# Patient Record
Sex: Female | Born: 1975 | Race: Asian | Hispanic: No | Marital: Married | Smoking: Never smoker
Health system: Southern US, Community
[De-identification: ages and names within clinical notes are randomized; demographics above are authoritative.]

---

## 2006-02-19 ENCOUNTER — Other Ambulatory Visit: Admission: RE | Admit: 2006-02-19 | Discharge: 2006-02-19 | Payer: Self-pay | Admitting: Gynecology

## 2006-05-07 ENCOUNTER — Ambulatory Visit (HOSPITAL_COMMUNITY): Admission: RE | Admit: 2006-05-07 | Discharge: 2006-05-07 | Payer: Self-pay | Admitting: Gynecology

## 2007-05-08 ENCOUNTER — Other Ambulatory Visit: Admission: RE | Admit: 2007-05-08 | Discharge: 2007-05-08 | Payer: Self-pay | Admitting: Obstetrics and Gynecology

## 2008-01-21 ENCOUNTER — Ambulatory Visit (HOSPITAL_COMMUNITY): Admission: RE | Admit: 2008-01-21 | Discharge: 2008-01-21 | Payer: Self-pay | Admitting: Obstetrics and Gynecology

## 2008-01-21 ENCOUNTER — Encounter (INDEPENDENT_AMBULATORY_CARE_PROVIDER_SITE_OTHER): Payer: Self-pay | Admitting: Obstetrics and Gynecology

## 2008-11-16 ENCOUNTER — Other Ambulatory Visit: Admission: RE | Admit: 2008-11-16 | Discharge: 2008-11-16 | Payer: Self-pay | Admitting: Obstetrics and Gynecology

## 2009-04-28 ENCOUNTER — Inpatient Hospital Stay (HOSPITAL_COMMUNITY): Admission: RE | Admit: 2009-04-28 | Discharge: 2009-05-01 | Payer: Self-pay | Admitting: Obstetrics and Gynecology

## 2009-04-28 ENCOUNTER — Encounter (INDEPENDENT_AMBULATORY_CARE_PROVIDER_SITE_OTHER): Payer: Self-pay | Admitting: Obstetrics and Gynecology

## 2010-04-09 ENCOUNTER — Encounter: Payer: Self-pay | Admitting: Obstetrics and Gynecology

## 2010-05-10 ENCOUNTER — Inpatient Hospital Stay (HOSPITAL_COMMUNITY)
Admission: AD | Admit: 2010-05-10 | Discharge: 2010-05-13 | DRG: 766 | Disposition: A | Discharge status: Home / Self Care | Payer: Medicaid Other | Source: Ambulatory Visit | Attending: Obstetrics and Gynecology | Admitting: Obstetrics and Gynecology

## 2010-05-10 ENCOUNTER — Other Ambulatory Visit: Payer: Self-pay | Admitting: Obstetrics and Gynecology

## 2010-05-10 DIAGNOSIS — O34219 Maternal care for unspecified type scar from previous cesarean delivery: Principal | ICD-10-CM | POA: Diagnosis present

## 2010-05-10 LAB — CBC
HCT: 28.7 % — ABNORMAL LOW (ref 36.0–46.0)
MCH: 20.2 pg — ABNORMAL LOW (ref 26.0–34.0)
MCHC: 28.9 g/dL — ABNORMAL LOW (ref 30.0–36.0)
MCV: 69.8 fL — ABNORMAL LOW (ref 78.0–100.0)
Platelets: 244 10*3/uL (ref 150–400)
RDW: 16.7 % — ABNORMAL HIGH (ref 11.5–15.5)

## 2010-05-10 LAB — ABO/RH: ABO/RH(D): AB POS

## 2010-05-11 LAB — CBC
HCT: 26.5 % — ABNORMAL LOW (ref 36.0–46.0)
MCV: 69 fL — ABNORMAL LOW (ref 78.0–100.0)
Platelets: 215 10*3/uL (ref 150–400)
RBC: 3.84 MIL/uL — ABNORMAL LOW (ref 3.87–5.11)
RDW: 16.5 % — ABNORMAL HIGH (ref 11.5–15.5)
WBC: 16.9 10*3/uL — ABNORMAL HIGH (ref 4.0–10.5)

## 2010-05-13 NOTE — Op Note (Signed)
[name], [name]                  ACCOUNT NO.:  [id number]  MEDICAL RECORD NO.:  [id number]           PATIENT TYPE:  I  LOCATION:  9103                          FACILITY:  [facility]  PHYSICIAN:  Gerald Leitz, MD          DATE OF BIRTH:  [birth year]  DATE OF PROCEDURE:  05/10/2010 DATE OF DISCHARGE:                              OPERATIVE REPORT   PREOPERATIVE DIAGNOSES: 1. 37-week-4-day intrauterine pregnancy. 2. Nonreassuring fetal testing 3. History of cesarean section. 4. Intrauterine fetal demise. 5. Late prenatal care.  POSTOPERATIVE DIAGNOSES: 1. 37-week-4-day intrauterine pregnancy. 2. Nonreassuring fetal testing 3. History of cesarean section. 4. Intrauterine fetal demise. 5. Late prenatal care.  PROCEDURE:  Repeat low transverse cesarean section.  SURGEON:  Gerald Leitz, MD  ASSISTANT:  None.  ANESTHESIA:  Spinal.  FINDINGS:  Female infant in cephalic presentation, Apgars of 9 and 9 at 1 and 5 minutes respectively, weight of 6 pounds 11 ounces.  SPECIMEN:  Placenta.  DISPOSITION OF SPECIMEN:  Pathology.  ESTIMATED BLOOD LOSS:  400 mL.  URINE OUTPUT:  800 mL.  FLUIDS:  1800 mL.  COMPLICATIONS:  None.  INDICATIONS:  This is a 35 year old G3, P2-0-0-1 at 37 weeks and 4 days intrauterine pregnancy with late prenatal care, presented to the office for antenatal testing due to the history of IUFD at term.  She had nonreassuring fetal testing with a BVP of 4/10 and was consented for repeat cesarean section.  PROCEDURE:  The patient was taken to the operating room where she received a spinal anesthetic.  This was found to be adequate.  She was prepped and draped in the usual sterile fashion.  A Pfannenstiel  skin incision was made with the scalpel, carried down to the underlying layer of fascia.  The fascia was excised in the midline and the incision was extended laterally with Mayo scissors.  The inferior aspect of the fascial incision was grasped with Kocher clamps,  elevated, and underlying rectus muscles were dissected off.  This was repeated on the superior aspect of the fascial incision.  Peritoneum was identified, tented up, and entered sharply with Metzenbaum scissors.  The incision was extended superiorly and inferiorly with good visualization of the bladder.  An Alexis retractor was then placed.  The vesicouterine peritoneum was identified, tented up, and entered sharply with Metzenbaum scissors.  This was extended laterally and the bladder flap was created digitally.  A low transverse incision was made with the scalpel.  The membranes were ruptured and the infant's head was delivered.  Mouth and nose were bulb suctioned.  Nuchal cord x1 was reduced.  The rest of the body was delivered.  The cord was clamped x2 and cut.  The infant was handed off to the awaiting neonatologist.  Cord blood as well as cord pH was drawn and the placenta was then expressed. The uterus was cleared of all clot and debris.  The uterine incision was repaired with 0 Vicryl in a running locked fashion.  Second layer of suture was used and excellent hemostasis was noted.  The abdomen was then  copiously irrigated, cleared of all clot and debris.  Excellent hemostasis was then assured and the Alexis retractor was removed.  The fascia was reapproximated with 0 PDS.  Scarpa's fascia was reapproximated with 2-0 Vicryl and the skin was closed with staples. Sponge, lap, and needle counts were correct x2.  The patient was taken to recovery room, awakened in stable condition.     Gerald Leitz, MD     TC/MEDQ  D:  05/10/2010  T:  05/11/2010  Job:  [id number]  Electronically Signed by Gerald Leitz MD on 05/13/2010 11:13:41 AM

## 2010-05-14 LAB — CROSSMATCH
Antibody Screen: NEGATIVE
Unit division: 0

## 2010-05-25 NOTE — Discharge Summary (Signed)
  NAMEJIMMY, [name]                  ACCOUNT NO.:  [id number]  MEDICAL RECORD NO.:  [id number]           PATIENT TYPE:  I  LOCATION:  9103                          [facility]  [facility]  PHYSICIAN:  Gerald Leitz, MD          DATE OF BIRTH:  09/05/1975  DATE OF ADMISSION:  05/10/2010 DATE OF DISCHARGE:  05/13/2010                              DISCHARGE SUMMARY   ADMISSION DIAGNOSES: 1. 37-week 4-day intrauterine pregnancy. 2. Nonreassuring fetal testing. 3. History of cesarean section, desires repeat. 4. History of intrauterine fetal demise. 5. Late prenatal care.  DISCHARGE DIAGNOSES: 1. 37-week 4-day intrauterine pregnancy. 2. Nonreassuring fetal testing. 3. History of cesarean section, desires repeat. 4. History of intrauterine fetal demise. 5. Late prenatal care. 6. Status post low transverse cesarean section.  BRIEF HOSPITAL COURSE:  The patient was seen in the office on May 10, 2010, for antenatal testing secondary to a history of IUFD with the previous pregnancy.  She was noted to have a BPP of 4/10 and was taken to the [facility] and had a cesarean section on May 10, 2010, delivered a live born female infant with Apgars of 9 and 9 at 1 and 5 minutes respectively, weighing 6 pounds 11 ounces.  She did well postoperatively.  Her hemoglobin on postop day #1 was 7.8.  She was discharged home in stable and improved condition on the following medications:  Motrin, Percocet, iron, and Reglan.  She will follow up in the office on February 27 for staple removal.     Gerald Leitz, MD     TC/MEDQ  D:  05/13/2010  T:  05/13/2010  Job:  [id number]  Electronically Signed by Gerald Leitz MD on 05/24/2010 08:41:24 AM

## 2010-06-06 LAB — CBC
HCT: 35.3 % — ABNORMAL LOW (ref 36.0–46.0)
Hemoglobin: 10.8 g/dL — ABNORMAL LOW (ref 12.0–15.0)
MCHC: 30.7 g/dL (ref 30.0–36.0)
MCHC: 30.8 g/dL (ref 30.0–36.0)
MCV: 74.9 fL — ABNORMAL LOW (ref 78.0–100.0)
MCV: 75.9 fL — ABNORMAL LOW (ref 78.0–100.0)
Platelets: 202 10*3/uL (ref 150–400)
RDW: 18.2 % — ABNORMAL HIGH (ref 11.5–15.5)

## 2010-07-31 NOTE — H&P (Signed)
[name], [name]                   ACCOUNT NO.:  [id number]   MEDICAL RECORD NO.:  [id number]           PATIENT TYPE:   LOCATION:                                 FACILITY:   PHYSICIAN:  Charles A. Delcambre, MDDATE OF BIRTH:  1975-06-03   DATE OF ADMISSION:  DATE OF DISCHARGE:                              HISTORY & PHYSICAL   This patient to be admitted to undergo diagnostic laparoscopy, possible  laser use secondary to completion of an  infertility workup and  inability to get pregnant.  She is a 35 year old, gravida 1, para 1-0-0-  0, in neonatal demise with vaginal birth.  She has periods lasting 2-3  days and then intermenstrual bleeding for spotting in the mid cycle as  well.  For this reason, we will have her undergo diagnostic hysteroscopy  and chromotubation.  She has had a normal hysterosalpingogram, normal  postcoital test, basal body temperatures have been difficult for her to  do, but she did have normal ovulatory progesterone, semen analysis on  her husband was normal.  We will proceed with laparoscopy at this time  and hysteroscopy D&C secondary to the irregular bleeding.  She gives  informed consent, accepts risks of infection, bleeding, bowel and  bladder damage, ureteral damage, blood product risk including hepatitis  and HIV exposure, uterine perforation, failure to control, lysis of  adhesions or cauterize all implants of endometriosis, operative  endoscopy, as well as hysteroscopy.  Consent is given.   PAST MEDICAL HISTORY:  None.   SURGICAL HISTORY:  None.   MEDICATIONS:  None.   ALLERGIES:  No known drug allergies.   SOCIAL HISTORY:  No tobacco, ethanol, or illicit drug use.  Married,  sexually active woman, one partner, husband monogamously.   FAMILY HISTORY:  Negative high blood pressure, diabetes, stroke or heart  attack, uterine cancer, breast cancer, ovarian cancer, osteoporosis.   REVIEW OF SYSTEMS:  Denies fever, chills, nausea, vomiting,  diarrhea,  constipation, bleeding per rectum, bleeding in the urine, chest pain,  shortness of breath, headache, vision changes.  She does have irregular  bleeding, intermenstrual bleeding as noted above.   PHYSICAL EXAMINATION:  GENERAL:  Alert and oriented x3.  No distress.  VITAL SIGNS:  Blood pressure 90/70, heart rate 64, weight 127 pounds.  CORONARY:  Regular rate and rhythm.  LUNGS:  Clear bilaterally.  ABDOMEN: Soft, nontender, no hepatosplenomegaly, no abdominal masses  noted.  PELVIC EXAM:  Normal external female genitalia.  Bartholin, urethra,  Skene within normal limit.  The vault without discharge or lesions.  Pap  was negative, February 2009.  Uterus nonenlarged mid plane.  ADNEXA:  Nontender without masses bilaterally.  Ovaries palpable, normal  in size bilaterally.   ASSESSMENT:  1. Infertility.  2. Bleeding.  We will proceed with diagnostic laparoscopy, operative      endoscopy as indicated.  Possible laser use, hysteroscopy, dilation      and curettage.  She will remain n.p.o. past night, even prior to      surgery.  Surgery is scheduled for January 25, 2008, which will  occur within the first 2 weeks with her phase of her cycle.  All      questions were answered and proceeded as outlined.      Charles A. Sydnee Cabal, MD  Electronically Signed     CAD/MEDQ  D:  12/25/2007  T:  12/26/2007  Job:  [id number]

## 2010-07-31 NOTE — Op Note (Signed)
[name], [name]                   ACCOUNT NO.:  [id number]   MEDICAL RECORD NO.:  [id number]          PATIENT TYPE:  AMB   LOCATION:  SDC                           [facility]  [facility]   PHYSICIAN:  [name], MDDATE OF BIRTH:  07-28-1975   DATE OF PROCEDURE:  01/21/2008  DATE OF DISCHARGE:                               OPERATIVE REPORT   PREOPERATIVE DIAGNOSES:  1. Infertility.  2. Abnormal uterine bleeding.   POSTOPERATIVE DIAGNOSES:  Severe endometriosis, endometrial polyps.   PROCEDURES:  1. Diagnostic laparoscopy.  2. Lysis of adhesions, moderate.  3. Left ureterolysis, major.  4. Cautery of endometriosis implants.  5. Biopsy of left uterosacral ligament implant.  6. Hysteroscopy.  7. Dilation and curettage.  8. Paracervical block.  9. Polypectomy.   SURGEON:  Kristina A. Delcambre, MD   ASSISTANT:  None.   COMPLICATIONS:  None.   ESTIMATED BLOOD LOSS:  Less than 25 mL.   ANESTHESIA:  General by endotracheal route.   OPERATIVE FINDINGS:  Partial cul-de-sac obliteration with the sigmoid up  against the uterosacral ligaments.  Multiple endometriosis implants  greater than 0.5 mL and appearing to be up to 3-mm deep, on the  sidewall, on the left, on the left uterine ovarian fossa, on the  uterosacral ligament mainly on the left, the center portion of the  uterosacral ligament where the sigmoid was adherent.  Fallopian tubes  appeared normal bilaterally and there was bilateral fill and spill with  chromotubation.   SPECIMEN:  Biopsy of the left uterosacral ligament implant to pathology.   DESCRIPTION OF PROCEDURE:  The patient was taken to the operating room,  placed in supine position.  She was placed in dorsal lithotomy position  after general anesthetic was induced without difficulty, and sterile  prep and drape was undertaken.  Cohen cannula was placed on the cervix  with tenaculum.  Paracervical block was done with 0.25% plain Marcaine,  this totaled  about 16 mL.  Attention was turned to the abdomen.  A 1-cm  incision was made at the umbilicus.  Veress needle was placed with  anterior traction on the abdominal wall.  Aspiration injection via  aspiration-hanging drop test all indicated an intraperitoneal location.  A 2.5 liters of CO2 all at less than 10 mmHg insufflation pressure  achieved adequate pneumoperitoneum.  Veress needle was removed in like  fashion.  Traction was placed in the anterior abdominal wall and then 10-  mm port was placed.  Under direct visualization, through the stab  incision at suprapubic area, a 5-mm port was placed.  Findings are noted  above.  Using bipolar cautery, some sharp dissection, and some blunt  dissection, most anatomy was moved to the correct anatomical position  without any injury to the organs.  The left sidewall was opened to  carefully isolate the ureter.  This was dissected down to the sidewall  to the pelvic brim area where it crossed beneath the sigmoid colon and  the bifurcation.  This was taken and sidewall was opened an area of the  major endometriosis implants.  Ureter was seen peristalsing and once  isolated, the edges of the opening where the endometriosis was contained  were cauterized with bipolar cautery.  There was no evidence of injury  to the ureter and the ureter was peristalsing after the treatment was  rendered.  Bipolar was used to cauterize other areas of endometriosis  throughout the pelvis.  In like fashion, the right ureter was seen  through the peritoneum and cautery was kept away from this area.  The  procedure was terminated in the pelvis with all known endometriosis  implants cauterized and this was felt to be done safely without damage  to the peristalsing left-to-right ureters, sigmoid colon, bladder, or  other structures.  Low-pressure visualization of the trocar site was  noted to be have good hemostasis.  The peritoneal edge was seen, which  were drawn upon  withdrawing the umbilical port.  Both ports were removed  and 0 Vicryl was placed on the umbilical port.  Skin edges fell together  well without further stitching.  For this reason, Dermabond was placed.  Dermabond was placed upon the suprapubic trocar area as well.  Attention  was turned to hysteroscopy.  Cohen cannula was removed.  Hanks dilators  were used to dilate up enough to pass the 5-mm scope.  Multiple shaggy  areas could be seen almost at 8 cm.  Polyp forceps did not yield much  tissue, but generalized curetting was done with a small amount of tissue  coming off.  Hysteroscopy was seen again.  There was no evidence of  perforation.  The previous polypoid areas were resected.  All  instruments were removed.  Tenaculum was removed.  Several nitrate was  used to cauterize the tenaculum site on the cervix.  The patient was  awakened and taken to recovery with physician in attendance and having  tolerated the procedure well.      Kristina A. Sydnee Cabal, MD  Electronically Signed     CAD/MEDQ  D:  01/21/2008  T:  01/22/2008  Job:  [id number]

## 2010-12-18 LAB — CBC
MCHC: 31
MCV: 75.7 — ABNORMAL LOW
Platelets: 241
RBC: 4.54
RDW: 13.3

## 2012-07-18 ENCOUNTER — Encounter (HOSPITAL_COMMUNITY): Payer: Self-pay | Admitting: Obstetrics and Gynecology

## 2012-07-18 ENCOUNTER — Ambulatory Visit (HOSPITAL_COMMUNITY): Payer: Medicaid Other

## 2012-07-18 ENCOUNTER — Inpatient Hospital Stay (HOSPITAL_COMMUNITY): Payer: Medicaid Other

## 2012-07-18 ENCOUNTER — Encounter (HOSPITAL_COMMUNITY)
Admission: AD | Disposition: A | Discharge status: Home / Self Care | Payer: Self-pay | Source: Ambulatory Visit | Attending: Obstetrics and Gynecology

## 2012-07-18 ENCOUNTER — Inpatient Hospital Stay (HOSPITAL_COMMUNITY)
Admission: RE | Admit: 2012-07-18 | Discharge: 2012-07-18 | Disposition: A | Discharge status: Home / Self Care | Payer: Medicaid Other | Source: Ambulatory Visit

## 2012-07-18 ENCOUNTER — Encounter (HOSPITAL_COMMUNITY): Payer: Self-pay

## 2012-07-18 ENCOUNTER — Ambulatory Visit (HOSPITAL_COMMUNITY)
Admission: AD | Admit: 2012-07-18 | Discharge: 2012-07-18 | Disposition: A | Discharge status: Home / Self Care | Payer: Medicaid Other | Source: Ambulatory Visit | Attending: Obstetrics and Gynecology | Admitting: Obstetrics and Gynecology

## 2012-07-18 DIAGNOSIS — O034 Incomplete spontaneous abortion without complication: Secondary | ICD-10-CM | POA: Insufficient documentation

## 2012-07-18 DIAGNOSIS — Z9889 Other specified postprocedural states: Secondary | ICD-10-CM

## 2012-07-18 HISTORY — PX: DILATION AND EVACUATION: SHX1459

## 2012-07-18 LAB — TYPE AND SCREEN: ABO/RH(D): AB POS

## 2012-07-18 LAB — POCT PREGNANCY, URINE: Preg Test, Ur: POSITIVE — AB

## 2012-07-18 LAB — URINALYSIS, ROUTINE W REFLEX MICROSCOPIC
Ketones, ur: NEGATIVE mg/dL
Leukocytes, UA: NEGATIVE
Protein, ur: NEGATIVE mg/dL
Specific Gravity, Urine: >1.03 — ABNORMAL HIGH (ref 1.005–1.030)
pH: 6 (ref 5.0–8.0)

## 2012-07-18 LAB — ABO/RH: ABO/RH(D): AB POS

## 2012-07-18 LAB — URINE MICROSCOPIC-ADD ON

## 2012-07-18 LAB — WET PREP, GENITAL
Clue Cells Wet Prep HPF POC: NONE SEEN
Trich, Wet Prep: NONE SEEN
Yeast Wet Prep HPF POC: NONE SEEN

## 2012-07-18 LAB — CBC WITH DIFFERENTIAL/PLATELET
Basophils Relative: 0 % (ref 0–1)
Lymphocytes Relative: 25 % (ref 12–46)
Lymphs Abs: 1.8 10*3/uL (ref 0.7–4.0)
Monocytes Absolute: 0.4 10*3/uL (ref 0.1–1.0)
Neutro Abs: 4.3 10*3/uL (ref 1.7–7.7)
Neutrophils Relative %: 58 % (ref 43–77)
RBC: 3.88 MIL/uL (ref 3.87–5.11)
RDW: 13.7 % (ref 11.5–15.5)

## 2012-07-18 SURGERY — DILATION AND EVACUATION, UTERUS
Anesthesia: Monitor Anesthesia Care | Site: Uterus | Wound class: Clean Contaminated

## 2012-07-18 MED ORDER — FENTANYL CITRATE 0.05 MG/ML IJ SOLN: 25.0000 ug | INTRAMUSCULAR | Status: DC | PRN

## 2012-07-18 MED ORDER — IBUPROFEN 200 MG PO TABS: 600.0000 mg | ORAL_TABLET | Freq: Four times a day (QID) | ORAL | Status: DC | PRN

## 2012-07-18 MED ORDER — MIDAZOLAM HCL 5 MG/5ML IJ SOLN
INTRAMUSCULAR | Status: DC | PRN
  Administered 2012-07-18 (×2): 1 mg via INTRAVENOUS

## 2012-07-18 MED ORDER — CEFAZOLIN SODIUM-DEXTROSE 2-3 GM-% IV SOLR: 2.0000 g | INTRAVENOUS | Status: DC

## 2012-07-18 MED ORDER — ONDANSETRON HCL 4 MG/2ML IJ SOLN: 4.0000 mg | Freq: Once | INTRAMUSCULAR | Status: DC | PRN

## 2012-07-18 MED ORDER — PROPOFOL 10 MG/ML IV EMUL
INTRAVENOUS | Status: DC | PRN
  Administered 2012-07-18: 40 mg via INTRAVENOUS
  Administered 2012-07-18 (×5): 20 mg via INTRAVENOUS
  Administered 2012-07-18: 10 mg via INTRAVENOUS
  Administered 2012-07-18 (×4): 20 mg via INTRAVENOUS
  Administered 2012-07-18: 10 mg via INTRAVENOUS

## 2012-07-18 MED ORDER — SILVER NITRATE-POT NITRATE 75-25 % EX MISC
CUTANEOUS | Status: DC | PRN
  Administered 2012-07-18: 2

## 2012-07-18 MED ORDER — FAMOTIDINE IN NACL 20-0.9 MG/50ML-% IV SOLN
20.0000 mg | Freq: Once | INTRAVENOUS | Status: AC
  Administered 2012-07-18: 20 mg via INTRAVENOUS
  Filled 2012-07-18: qty 50

## 2012-07-18 MED ORDER — FENTANYL CITRATE 0.05 MG/ML IJ SOLN
INTRAMUSCULAR | Status: DC | PRN
  Administered 2012-07-18: 100 ug via INTRAVENOUS

## 2012-07-18 MED ORDER — CEFAZOLIN SODIUM-DEXTROSE 2-3 GM-% IV SOLR
2.0000 g | Freq: Once | INTRAVENOUS | Status: AC
  Administered 2012-07-18: 2 g via INTRAVENOUS

## 2012-07-18 MED ORDER — CITRIC ACID-SODIUM CITRATE 334-500 MG/5ML PO SOLN
30.0000 mL | Freq: Once | ORAL | Status: AC
  Administered 2012-07-18: 30 mL via ORAL
  Filled 2012-07-18: qty 15

## 2012-07-18 MED ORDER — LACTATED RINGERS IV SOLN
INTRAVENOUS | Status: DC | PRN
  Administered 2012-07-18 (×3): via INTRAVENOUS

## 2012-07-18 MED ORDER — KETOROLAC TROMETHAMINE 30 MG/ML IJ SOLN: 15.0000 mg | Freq: Once | INTRAMUSCULAR | Status: DC | PRN

## 2012-07-18 MED ORDER — DEXAMETHASONE SODIUM PHOSPHATE 4 MG/ML IJ SOLN
INTRAMUSCULAR | Status: DC | PRN
  Administered 2012-07-18: 10 mg via INTRAVENOUS

## 2012-07-18 MED ORDER — MISOPROSTOL 200 MCG PO TABS: 200.0000 ug | ORAL_TABLET | Freq: Four times a day (QID) | ORAL | Status: DC

## 2012-07-18 MED ORDER — LACTATED RINGERS IV SOLN
INTRAVENOUS | Status: DC
  Administered 2012-07-18: 11:00:00 via INTRAVENOUS

## 2012-07-18 MED ORDER — METHYLERGONOVINE MALEATE 0.2 MG/ML IJ SOLN
INTRAMUSCULAR | Status: DC | PRN
  Administered 2012-07-18: 0.2 mg via INTRAMUSCULAR

## 2012-07-18 MED ORDER — SODIUM CHLORIDE 0.9 % IR SOLN
Status: DC | PRN
  Administered 2012-07-18: 1000 mL

## 2012-07-18 MED ORDER — MEPERIDINE HCL 25 MG/ML IJ SOLN: 6.2500 mg | INTRAMUSCULAR | Status: DC | PRN

## 2012-07-18 MED ORDER — KETOROLAC TROMETHAMINE 30 MG/ML IJ SOLN
INTRAMUSCULAR | Status: DC | PRN
  Administered 2012-07-18: 30 mg via INTRAVENOUS

## 2012-07-18 MED ORDER — LIDOCAINE HCL 2 % IJ SOLN
INTRAMUSCULAR | Status: DC | PRN
  Administered 2012-07-18: 10 mL

## 2012-07-18 MED ORDER — LACTATED RINGERS IV BOLUS (SEPSIS): 1000.0000 mL | Freq: Once | INTRAVENOUS | Status: DC

## 2012-07-18 MED ORDER — ONDANSETRON HCL 4 MG/2ML IJ SOLN
INTRAMUSCULAR | Status: DC | PRN
  Administered 2012-07-18: 4 mg via INTRAVENOUS

## 2012-07-18 MED ORDER — CEFAZOLIN SODIUM-DEXTROSE 2-3 GM-% IV SOLR
2.0000 g | Freq: Once | INTRAVENOUS | Status: DC
  Filled 2012-07-18: qty 50

## 2012-07-18 SURGICAL SUPPLY — 19 items
CATH ROBINSON RED A/P 16FR (CATHETERS) ×2 IMPLANT
CLOTH BEACON ORANGE TIMEOUT ST (SAFETY) ×2 IMPLANT
DECANTER SPIKE VIAL GLASS SM (MISCELLANEOUS) ×2 IMPLANT
GLOVE BIO SURGEON STRL SZ7 (GLOVE) ×4 IMPLANT
GOWN STRL REIN XL XLG (GOWN DISPOSABLE) ×4 IMPLANT
KIT BERKELEY 1ST TRIMESTER 3/8 (MISCELLANEOUS) ×2 IMPLANT
NDL SPNL 22GX3.5 QUINCKE BK (NEEDLE) ×1 IMPLANT
NEEDLE SPNL 22GX3.5 QUINCKE BK (NEEDLE) ×2 IMPLANT
NS IRRIG 1000ML POUR BTL (IV SOLUTION) ×2 IMPLANT
PACK VAGINAL MINOR WOMEN LF (CUSTOM PROCEDURE TRAY) ×2 IMPLANT
PAD OB MATERNITY 4.3X12.25 (PERSONAL CARE ITEMS) ×2 IMPLANT
PAD PREP 24X48 CUFFED NSTRL (MISCELLANEOUS) ×2 IMPLANT
SET BERKELEY SUCTION TUBING (SUCTIONS) ×2 IMPLANT
SYR CONTROL 10ML LL (SYRINGE) ×2 IMPLANT
TOWEL OR 17X24 6PK STRL BLUE (TOWEL DISPOSABLE) ×4 IMPLANT
VACURETTE 10 RIGID CVD (CANNULA) IMPLANT
VACURETTE 7MM CVD STRL WRAP (CANNULA) IMPLANT
VACURETTE 8 RIGID CVD (CANNULA) ×1 IMPLANT
VACURETTE 9 RIGID CVD (CANNULA) IMPLANT

## 2012-07-18 NOTE — MAU Note (Signed)
Pt's sister used for interpretor per patient requests. Interpretor name HME

## 2012-07-18 NOTE — Anesthesia Postprocedure Evaluation (Signed)
Anesthesia Post Note  Patient: [name]  Procedure(s) Performed: Procedure(s) (LRB): DILATATION AND EVACUATION (N/A)  Anesthesia type: MAC  Patient location: PACU  Post pain: Pain level controlled  Post assessment: Post-op Vital signs reviewed  Last Vitals:  Filed Vitals:   07/18/12 1315  BP: 105/60  Pulse: 83  Temp:   Resp: 16    Post vital signs: Reviewed  Level of consciousness: sedated  Complications: No apparent anesthesia complications

## 2012-07-18 NOTE — Transfer of Care (Signed)
Immediate Anesthesia Transfer of Care Note  Patient: [name]  Procedure(s) Performed: Procedure(s): DILATATION AND EVACUATION (N/A)  Patient Location: PACU  Anesthesia Type:MAC  Level of Consciousness: awake, alert  and oriented  Airway & Oxygen Therapy: Patient Spontanous Breathing and Patient connected to nasal cannula oxygen  Post-op Assessment: Report given to PACU RN and Post -op Vital signs reviewed and stable  Post vital signs: Reviewed and stable  Complications: No apparent anesthesia complications

## 2012-07-18 NOTE — MAU Provider Note (Signed)
History     CSN: [id number]  Arrival date and time: 07/18/12 7829   First Provider Initiated Contact with Patient 07/18/12 [id number]      Chief Complaint  Patient presents with  . Vaginal Bleeding   HPI [name] is 37 y.o. [id number] [redacted]w[redacted]d weeks presenting with vaginal bleeding in first trimester pregnancy.  Began 4 days ago and is now heavier, occurs only when she urinates.  Dark in color, tiny clots.   Lower abdominal cramping.  Denies dysuria.   She is a patient of Dr. Dawayne Patricia.  She has been seen for UPT in the office.  She called the office yesterday and instructed to come here last night but the sister states the bleeding was light so they waited until this morning to come in.  She is asking for Verification letter for Medicaid. She doesn't speak much [place], sister requesting to be her interpreter.     No past medical history on file.  No past surgical history on file.  No family history on file.  History  Substance Use Topics  . Smoking status: Not on file  . Smokeless tobacco: Not on file  . Alcohol Use: Not on file    Allergies: No Known Allergies  No prescriptions prior to admission    Review of Systems  Constitutional: Negative for fever and chills.  Gastrointestinal: Positive for abdominal pain (lower abdominal cramping). Negative for nausea and vomiting.  Genitourinary: Positive for frequency. Negative for dysuria.       + for vaginal bleeding  Neurological: Negative for headaches.   Physical Exam   Blood pressure 116/73, pulse 90, temperature 98.2 F (36.8 C), temperature source Oral, resp. rate 18, last menstrual period 04/12/2012.  Physical Exam  Nursing note and vitals reviewed. Constitutional: She is oriented to person, place, and time. She appears well-developed and well-nourished. No distress.  HENT:  Head: Normocephalic.  Neck: Normal range of motion.  Cardiovascular: Normal rate.   Respiratory: Effort normal.  GI: Soft. She exhibits no distension  and no mass. There is no tenderness. There is no rebound and no guarding.  Genitourinary: There is no rash, tenderness or lesion on the right labia. There is no rash, tenderness or lesion on the left labia. Uterus is enlarged. Uterus is not tender. Cervix exhibits no motion tenderness, no discharge and no friability. Right adnexum displays no mass, no tenderness and no fullness. Left adnexum displays no mass, no tenderness and no fullness. There is bleeding (small amount of dark blood with stringy clots) around the vagina. No erythema or tenderness around the vagina. No vaginal discharge found.  Neurological: She is alert and oriented to person, place, and time.  Skin: Skin is warm and dry.  Psychiatric: She has a normal mood and affect. Her behavior is normal.   Results for orders placed during the hospital encounter of 07/18/12 (from the past 24 hour(s))  URINALYSIS, ROUTINE W REFLEX MICROSCOPIC     Status: Abnormal   Collection Time    07/18/12  8:45 AM      Result Value Range   Color, Urine YELLOW  YELLOW   APPearance HAZY (*) CLEAR   Specific Gravity, Urine >1.030 (*) 1.005 - 1.030   pH 6.0  5.0 - 8.0   Glucose, UA NEGATIVE  NEGATIVE mg/dL   Hgb urine dipstick LARGE (*) NEGATIVE   Bilirubin Urine NEGATIVE  NEGATIVE   Ketones, ur NEGATIVE  NEGATIVE mg/dL   Protein, ur NEGATIVE  NEGATIVE mg/dL  Urobilinogen, UA 0.2  0.0 - 1.0 mg/dL   Nitrite NEGATIVE  NEGATIVE   Leukocytes, UA NEGATIVE  NEGATIVE  URINE MICROSCOPIC-ADD ON     Status: Abnormal   Collection Time    07/18/12  8:45 AM      Result Value Range   Squamous Epithelial / LPF FEW (*) RARE   WBC, UA 0-2  <3 WBC/hpf   RBC / HPF 11-20  <3 RBC/hpf   Bacteria, UA FEW (*) RARE  POCT PREGNANCY, URINE     Status: Abnormal   Collection Time    07/18/12  9:14 AM      Result Value Range   Preg Test, Ur POSITIVE (*) NEGATIVE  WET PREP, GENITAL     Status: Abnormal   Collection Time    07/18/12  9:25 AM      Result Value Range    Yeast Wet Prep HPF POC NONE SEEN  NONE SEEN   Trich, Wet Prep NONE SEEN  NONE SEEN   Clue Cells Wet Prep HPF POC NONE SEEN  NONE SEEN   WBC, Wet Prep HPF POC FEW (*) NONE SEEN  CBC WITH DIFFERENTIAL     Status: Abnormal   Collection Time    07/18/12  9:39 AM      Result Value Range   WBC 7.3  4.0 - 10.5 K/uL   RBC 3.88  3.87 - 5.11 MIL/uL   Hemoglobin 8.9 (*) 12.0 - 15.0 g/dL   HCT 08.6 (*) 57.8 - 46.9 %   MCV 74.0 (*) 78.0 - 100.0 fL   MCH 22.9 (*) 26.0 - 34.0 pg   MCHC 31.0  30.0 - 36.0 g/dL   RDW 62.9  52.8 - 41.3 %   Platelets 248  150 - 400 K/uL   Neutrophils Relative 58  43 - 77 %   Lymphocytes Relative 25  12 - 46 %   Monocytes Relative 6  3 - 12 %   Eosinophils Relative 11 (*) 0 - 5 %   Basophils Relative 0  0 - 1 %   Neutro Abs 4.3  1.7 - 7.7 K/uL   Lymphs Abs 1.8  0.7 - 4.0 K/uL   Monocytes Absolute 0.4  0.1 - 1.0 K/uL   Eosinophils Absolute 0.8 (*) 0.0 - 0.7 K/uL   Basophils Absolute 0.0  0.0 - 0.1 K/uL   RBC Morphology POLYCHROMASIA PRESENT    ABO/RH     Status: None   Collection Time    07/18/12  9:40 AM      Result Value Range   ABO/RH(D) AB POS    HCG, QUANTITATIVE, PREGNANCY     Status: Abnormal   Collection Time    07/18/12  9:40 AM      Result Value Range   hCG, Beta Chain, Quant, S 2833 (*) <5 mIU/mL   Clinical Data: Bleeding.  OBSTETRIC <14 WK [place] AND TRANSVAGINAL OB US  Technique: Both transabdominal and transvaginal ultrasound  examinations were performed for complete evaluation of the  gestation as well as the maternal uterus, adnexal regions, and  pelvic cul-de-sac. Transvaginal technique was performed to assess  early pregnancy.  Comparison: 04/28/2009  Findings: No evidence for a live intrauterine pregnancy. There is  a fluid collection within the endometrial cavity. The endometrial  fluid collection measures up to 5.2 cm in length. Endometrium is  diffusely thickened and irregular, roughly measures 3 cm. There is  a focal hypoechoic area  within the endometrium. There is some  vascular flow within the thickened endometrium.  The left ovary measures 3.2 x 1.1 x 1.4 cm. Right ovary measures  2.7 x 1.38 x 1.9 cm. No evidence for free fluid.  IMPRESSION: There is a large amount of fluid within endometrial  cavity and the endometrium appears to be thickened and irregular.  Retained products of conception cannot be excluded. No evidence  for a live intrauterine pregnancy.  Normal appearance of the ovaries.  Original Report Authenticated By: Richarda Overlie, M.D.    MAU Course  Procedures  MDM On return from Ultrasound, the tech reports the patient told her she passed the pregnancy 5 days ago--this was not reported to me at the time of her MSE.  I reported MSE, physical exam findings, labs and U/S report to Dr. Dion Body.  Plan-Suction D&C. I discussed at length the findings, lab and Ultrasound results with the patient.  She agrees with plan for D&C.  She reports having not had anything to eat or drink since last night.  I asked her this question several times to make sure she has been NPO for 6 hrs.  Same answer each time--nothing since last night.    Assessment and Plan  A: Retained products of conception    Anemia  P:  To OR for Suction D&C by Dr. Caren Macadam M 07/18/2012, 11:01 AM

## 2012-07-18 NOTE — Brief Op Note (Signed)
07/18/2012  1:42 PM  PATIENT:  [name]  37 y.o. female  PRE-OPERATIVE DIAGNOSIS:  incomplete abortion  POST-OPERATIVE DIAGNOSIS:  incomplete abortion  PROCEDURE:  Suction D&C  SURGEON:  Surgeon(s) and Role:    * Geryl Rankins, MD - Primary  PHYSICIAN ASSISTANT:   ASSISTANTS: none   ANESTHESIA:   Paracervical block, IV sedation  EBL:  Total I/O In: 1500 [I.V.:1500] Out: 475 [Urine:400; Blood:75]  BLOOD ADMINISTERED:none  DRAINS: none   LOCAL MEDICATIONS USED:  2% LIDOCAINE 10 cc  SPECIMEN:  Source of Specimen:  Products of conception  DISPOSITION OF SPECIMEN:  PATHOLOGY  COUNTS:  YES  TOURNIQUET:  * No tourniquets in log *  DICTATION: .Other Dictation: Dictation Number [id number]  PLAN OF CARE: Discharge to home after PACU  PATIENT DISPOSITION:  PACU - hemodynamically stable.   Delay start of Pharmacological VTE agent (>24hrs) due to surgical blood loss or risk of bleeding: not applicable

## 2012-07-18 NOTE — OR Nursing (Signed)
Patient sent for at 1140.

## 2012-07-18 NOTE — MAU Note (Signed)
[name] is here today with complaints of bleeding. She is [redacted] weeks pregnant and starting spotting on the 25th of April. The bleeding has increased and is heavy today.

## 2012-07-18 NOTE — H&P (Signed)
[name] is an 37 y.o. female G4 P3002 at ~13 weeks by LMP presents with c/o vaginal bleeding.  H/o obtained with the assistance of a translator, pt's sister. Pt had verification of pregnancy at [facility] 2 weeks ago.  Five days ago she started having vaginal bleeding.  She states she felt like the pregnancy passed 5 days ago but her bleeding was never heavier than a period.  States bleeding is light.  Passing small clots occasionally.  Pertinent Gynecological History: MOB History: G4, P   Menstrual History:  Patient's last menstrual period was 04/12/2012.    No past medical history on file.  No past surgical history on file.  No family history on file.  Social History:  has no tobacco, alcohol, and drug history on file.  Allergies: No Known Allergies  No prescriptions prior to admission    Review of Systems  Respiratory: Negative for shortness of breath.   Gastrointestinal: Negative for abdominal pain.    Blood pressure 109/54, pulse 86, temperature 98.2 F (36.8 C), temperature source Oral, resp. rate 18, height 5' (1.524 m), weight 65.318 kg (144 lb), last menstrual period 04/12/2012. Physical Exam  Constitutional: She appears well-developed and well-nourished. No distress.  HENT:  Head: Normocephalic and atraumatic.  Eyes: EOM are normal.  Neck: Normal range of motion.  Cardiovascular: Normal rate, regular rhythm and normal heart sounds.   No murmur heard. Respiratory: Effort normal and breath sounds normal. No respiratory distress. She has no wheezes.  GI: Soft.  Genitourinary:  Deferred to OR.  Reviewed provider notes.  Musculoskeletal: She exhibits no edema and no tenderness.  Neurological: She is alert.  Skin: Skin is warm and dry. She is not diaphoretic.  Psychiatric: She has a normal mood and affect.    Results for orders placed during the hospital encounter of 07/18/12 (from the past 24 hour(s))  URINALYSIS, ROUTINE W REFLEX MICROSCOPIC     Status:  Abnormal   Collection Time    07/18/12  8:45 AM      Result Value Range   Color, Urine YELLOW  YELLOW   APPearance HAZY (*) CLEAR   Specific Gravity, Urine >1.030 (*) 1.005 - 1.030   pH 6.0  5.0 - 8.0   Glucose, UA NEGATIVE  NEGATIVE mg/dL   Hgb urine dipstick LARGE (*) NEGATIVE   Bilirubin Urine NEGATIVE  NEGATIVE   Ketones, ur NEGATIVE  NEGATIVE mg/dL   Protein, ur NEGATIVE  NEGATIVE mg/dL   Urobilinogen, UA 0.2  0.0 - 1.0 mg/dL   Nitrite NEGATIVE  NEGATIVE   Leukocytes, UA NEGATIVE  NEGATIVE  URINE MICROSCOPIC-ADD ON     Status: Abnormal   Collection Time    07/18/12  8:45 AM      Result Value Range   Squamous Epithelial / LPF FEW (*) RARE   WBC, UA 0-2  <3 WBC/hpf   RBC / HPF 11-20  <3 RBC/hpf   Bacteria, UA FEW (*) RARE  POCT PREGNANCY, URINE     Status: Abnormal   Collection Time    07/18/12  9:14 AM      Result Value Range   Preg Test, Ur POSITIVE (*) NEGATIVE  WET PREP, GENITAL     Status: Abnormal   Collection Time    07/18/12  9:25 AM      Result Value Range   Yeast Wet Prep HPF POC NONE SEEN  NONE SEEN   Trich, Wet Prep NONE SEEN  NONE SEEN   Clue  Cells Wet Prep HPF POC NONE SEEN  NONE SEEN   WBC, Wet Prep HPF POC FEW (*) NONE SEEN  CBC WITH DIFFERENTIAL     Status: Abnormal   Collection Time    07/18/12  9:39 AM      Result Value Range   WBC 7.3  4.0 - 10.5 K/uL   RBC 3.88  3.87 - 5.11 MIL/uL   Hemoglobin 8.9 (*) 12.0 - 15.0 g/dL   HCT 29.5 (*) 62.1 - 30.8 %   MCV 74.0 (*) 78.0 - 100.0 fL   MCH 22.9 (*) 26.0 - 34.0 pg   MCHC 31.0  30.0 - 36.0 g/dL   RDW 65.7  84.6 - 96.2 %   Platelets 248  150 - 400 K/uL   Neutrophils Relative 58  43 - 77 %   Lymphocytes Relative 25  12 - 46 %   Monocytes Relative 6  3 - 12 %   Eosinophils Relative 11 (*) 0 - 5 %   Basophils Relative 0  0 - 1 %   Neutro Abs 4.3  1.7 - 7.7 K/uL   Lymphs Abs 1.8  0.7 - 4.0 K/uL   Monocytes Absolute 0.4  0.1 - 1.0 K/uL   Eosinophils Absolute 0.8 (*) 0.0 - 0.7 K/uL   Basophils  Absolute 0.0  0.0 - 0.1 K/uL   RBC Morphology POLYCHROMASIA PRESENT    ABO/RH     Status: None   Collection Time    07/18/12  9:40 AM      Result Value Range   ABO/RH(D) AB POS    HCG, QUANTITATIVE, PREGNANCY     Status: Abnormal   Collection Time    07/18/12  9:40 AM      Result Value Range   hCG, Beta Chain, Quant, S 2833 (*) <5 mIU/mL  TYPE AND SCREEN     Status: None   Collection Time    07/18/12  9:44 AM      Result Value Range   ABO/RH(D) AB POS     Antibody Screen PENDING     Sample Expiration 07/21/2012      US Ob Comp Less 14 Wks  07/18/2012  *RADIOLOGY REPORT*  Clinical Data: Bleeding.  OBSTETRIC <14 WK [place] AND TRANSVAGINAL OB US  Technique:  Both transabdominal and transvaginal ultrasound examinations were performed for complete evaluation of the gestation as well as the maternal uterus, adnexal regions, and pelvic cul-de-sac.  Transvaginal technique was performed to assess early pregnancy.  Comparison:  04/28/2009  Findings:  No evidence for a live intrauterine pregnancy.  There is a fluid collection within the endometrial cavity.   The endometrial fluid collection measures up to 5.2 cm in length.  Endometrium is diffusely thickened and irregular, roughly measures 3 cm.  There is a focal hypoechoic area within the endometrium.  There is some vascular flow within the thickened endometrium.  The left ovary measures 3.2 x 1.1 x 1.4 cm.  Right ovary measures 2.7 x 1.38 x 1.9 cm.  No evidence for free fluid.  IMPRESSION: There is a large amount of fluid within endometrial cavity and the endometrium appears to be thickened and irregular. Retained products of conception cannot be excluded.  No evidence for a live intrauterine pregnancy.  Normal appearance of the ovaries.   Original Report Authenticated By: Richarda Overlie, M.D.    US Ob Transvaginal  07/18/2012  *RADIOLOGY REPORT*  Clinical Data: Bleeding.  OBSTETRIC <14 WK [place] AND TRANSVAGINAL OB US  Technique:  Both transabdominal and  transvaginal ultrasound examinations were performed for complete evaluation of the gestation as well as the maternal uterus, adnexal regions, and pelvic cul-de-sac.  Transvaginal technique was performed to assess early pregnancy.  Comparison:  04/28/2009  Findings:  No evidence for a live intrauterine pregnancy.  There is a fluid collection within the endometrial cavity.   The endometrial fluid collection measures up to 5.2 cm in length.  Endometrium is diffusely thickened and irregular, roughly measures 3 cm.  There is a focal hypoechoic area within the endometrium.  There is some vascular flow within the thickened endometrium.  The left ovary measures 3.2 x 1.1 x 1.4 cm.  Right ovary measures 2.7 x 1.38 x 1.9 cm.  No evidence for free fluid.  IMPRESSION: There is a large amount of fluid within endometrial cavity and the endometrium appears to be thickened and irregular. Retained products of conception cannot be excluded.  No evidence for a live intrauterine pregnancy.  Normal appearance of the ovaries.   Original Report Authenticated By: Richarda Overlie, M.D.     Assessment/Plan: Incomplete Abortion, Retained POCs.-Suction D&C advised. Anemia.  Due to amount of tissue in uterus and low hemoglobin, Cytotec not recommended. Pt counseled on R/B/A of procedure. Consent obtained. Discharge home after PACU with Methergine x 24 hours and Iron for anemia. F/u with Dr. Richardson Dopp in 2 weeks.  Geryl Rankins 07/18/2012, 11:57 AM

## 2012-07-18 NOTE — Anesthesia Preprocedure Evaluation (Signed)
Anesthesia Evaluation  Patient identified by MRN, date of birth, ID band Patient awake    Reviewed: Allergy & Precautions, H&P , NPO status , Patient's Chart, lab work & pertinent test results  Airway Mallampati: II TM Distance: >3 FB Neck ROM: full    Dental no notable dental hx. (+) Teeth Intact   Pulmonary neg pulmonary ROS,    Pulmonary exam normal       Cardiovascular negative cardio ROS      Neuro/Psych negative neurological ROS  negative psych ROS   GI/Hepatic negative GI ROS, Neg liver ROS,   Endo/Other  negative endocrine ROS  Renal/GU negative Renal ROS  negative genitourinary   Musculoskeletal negative musculoskeletal ROS (+)   Abdominal Normal abdominal exam  (+)   Peds negative pediatric ROS (+)  Hematology negative hematology ROS (+)   Anesthesia Other Findings   Reproductive/Obstetrics negative OB ROS                           Anesthesia Physical Anesthesia Plan  ASA: II  Anesthesia Plan: MAC   Post-op Pain Management:    Induction:   Airway Management Planned:   Additional Equipment:   Intra-op Plan:   Post-operative Plan:   Informed Consent: I have reviewed the patients History and Physical, chart, labs and discussed the procedure including the risks, benefits and alternatives for the proposed anesthesia with the patient or authorized representative who has indicated his/her understanding and acceptance.     Plan Discussed with: CRNA and Surgeon  Anesthesia Plan Comments:         Anesthesia Quick Evaluation

## 2012-07-19 LAB — GC/CHLAMYDIA PROBE AMP
CT Probe RNA: NEGATIVE
GC Probe RNA: NEGATIVE

## 2012-07-19 NOTE — Op Note (Signed)
[name], [name]                  ACCOUNT NO.:  [id number]  MEDICAL RECORD NO.:  [id number]  LOCATION:  [facility]                          FACILITY:  [facility]  PHYSICIAN:  Pieter Partridge, MD   DATE OF BIRTH:  [birth year]  DATE OF PROCEDURE:  07/18/2012 DATE OF DISCHARGE:  07/18/2012                              OPERATIVE REPORT   PREOPERATIVE DIAGNOSIS:  Incomplete abortion.  POSTOPERATIVE DIAGNOSIS:  Incomplete abortion.  PROCEDURE:  Suction, dilatation and curettage.  SURGEON:  Pieter Partridge, MD  ASSISTANT:  None.  ANESTHESIA:  Paracervical block and IV sedation.  ESTIMATED BLOOD LOSS:  75.  IV FLUIDS:  1500.  URINE OUTPUT:  400.  BLOOD ADMINISTERED:  None.  DRAINS:  None.  Local 2% lidocaine 10 mL.  SPECIMEN:  Products of conception to Pathology.  DISPOSITION:  To PACU, hemodynamically stable.  COMPLICATIONS:  None.  FINDINGS:  Copious amount of retained products, and cords, uterus enlarged about 12 weeks by bimanual exam 12-13 weeks and sounded to 10 cm.  The ultrasound after procedure confirmed adequate curettage.  The patient was identified in the holding area.  She was then taken to the operating room, where she underwent IV sedation without complication.  She was prepped and draped.  She was placed in the dorsal lithotomy position and prepped and draped in a normal sterile fashion.  Graves speculum was then inserted into the vagina and a paracervical block was performed.  Of note, she had some bleeding from the just where the paracervical block.  The cervix was easily dilated up to a 7 Hegar, and 8 suction curette was then selected and advanced to the uterine fundus  after the uterus was sounded to 10 cm.  A suction curette was performed revealing a lot of tissue and several passes were made.  Then, a sharp curettage of all 4 quadrants was performed and that returned tissue as well.  Another suction curettage was performed and when the gritty cry was noted,  I just wanted to make sure that all of the tissue had been removed.  Ultrasound came in and the uterine sound was placed in the endocervical canal for easily identify and the endometrial stripe appeared thin.  The single-tooth tenaculum was applied to the anterior lip of the cervix that was removed and silver nitrate was used for hemostasis.  She did have a little bit of bleeding from the os, Methergine was given prior to the ultrasound and appeared dry, and then after placing the uterine sound again, it seemed that there are some bleeding, but with massage and pressure the bleeding reduced.  All instruments were removed from the vagina.  The patient was awakened and went to recovery room in stable condition.  She did have an INO cath prior to the procedure under sterile technique and got Ancef 2 g IV prior to the procedure.  SCDs were on and operating.  The patient tolerated the procedure well.     Pieter Partridge, MD     EBV/MEDQ  D:  07/18/2012  T:  07/19/2012  Job:  [id number]

## 2012-07-20 ENCOUNTER — Encounter (HOSPITAL_COMMUNITY): Payer: Self-pay | Admitting: Obstetrics and Gynecology

## 2012-07-21 NOTE — MAU Provider Note (Signed)
See H& P.

## 2013-05-23 ENCOUNTER — Encounter (HOSPITAL_COMMUNITY): Payer: Self-pay | Admitting: *Deleted

## 2013-05-28 ENCOUNTER — Other Ambulatory Visit: Payer: Self-pay | Admitting: Obstetrics and Gynecology

## 2013-05-28 ENCOUNTER — Other Ambulatory Visit (HOSPITAL_COMMUNITY)
Admission: RE | Admit: 2013-05-28 | Discharge: 2013-05-28 | Disposition: A | Discharge status: Home / Self Care | Payer: Medicaid Other | Source: Ambulatory Visit | Attending: Obstetrics and Gynecology | Admitting: Obstetrics and Gynecology

## 2013-05-28 DIAGNOSIS — Z01419 Encounter for gynecological examination (general) (routine) without abnormal findings: Secondary | ICD-10-CM | POA: Insufficient documentation

## 2013-05-28 DIAGNOSIS — Z113 Encounter for screening for infections with a predominantly sexual mode of transmission: Secondary | ICD-10-CM | POA: Insufficient documentation

## 2013-05-28 DIAGNOSIS — Z1151 Encounter for screening for human papillomavirus (HPV): Secondary | ICD-10-CM | POA: Insufficient documentation

## 2013-05-28 LAB — OB RESULTS CONSOLE GC/CHLAMYDIA
Chlamydia: NEGATIVE
GC PROBE AMP, GENITAL: NEGATIVE

## 2013-05-28 LAB — OB RESULTS CONSOLE HIV ANTIBODY (ROUTINE TESTING)
HIV: NONREACTIVE
HIV: NONREACTIVE

## 2013-05-28 LAB — OB RESULTS CONSOLE ABO/RH: RH Type: POSITIVE

## 2013-05-28 LAB — OB RESULTS CONSOLE RPR
RPR: NONREACTIVE
RPR: NONREACTIVE

## 2013-05-28 LAB — OB RESULTS CONSOLE ANTIBODY SCREEN: ANTIBODY SCREEN: NEGATIVE

## 2013-05-28 LAB — CYTOLOGY - PAP: Pap: NEGATIVE

## 2013-05-31 ENCOUNTER — Encounter (HOSPITAL_COMMUNITY): Payer: Self-pay | Admitting: Obstetrics and Gynecology

## 2013-06-03 ENCOUNTER — Other Ambulatory Visit (HOSPITAL_COMMUNITY): Payer: Self-pay | Admitting: Obstetrics and Gynecology

## 2013-06-03 DIAGNOSIS — O09529 Supervision of elderly multigravida, unspecified trimester: Secondary | ICD-10-CM

## 2013-06-03 DIAGNOSIS — Z3689 Encounter for other specified antenatal screening: Secondary | ICD-10-CM

## 2013-06-03 DIAGNOSIS — O09299 Supervision of pregnancy with other poor reproductive or obstetric history, unspecified trimester: Secondary | ICD-10-CM

## 2013-06-08 ENCOUNTER — Other Ambulatory Visit (HOSPITAL_COMMUNITY): Payer: Medicaid Other

## 2013-06-08 ENCOUNTER — Ambulatory Visit (HOSPITAL_COMMUNITY): Payer: Medicaid Other | Attending: Obstetrics and Gynecology

## 2013-06-08 ENCOUNTER — Ambulatory Visit (HOSPITAL_COMMUNITY): Admission: RE | Admit: 2013-06-08 | Payer: Medicaid Other | Source: Ambulatory Visit

## 2013-06-16 ENCOUNTER — Encounter: Payer: Self-pay | Admitting: *Deleted

## 2013-07-13 ENCOUNTER — Encounter: Payer: Self-pay | Admitting: Advanced Practice Midwife

## 2013-07-13 ENCOUNTER — Ambulatory Visit (INDEPENDENT_AMBULATORY_CARE_PROVIDER_SITE_OTHER): Payer: Medicaid Other | Admitting: Advanced Practice Midwife

## 2013-07-13 VITALS — BP 128/84 | HR 112 | Temp 98.3°F | Ht 58.5 in | Wt 151.9 lb

## 2013-07-13 DIAGNOSIS — Z23 Encounter for immunization: Secondary | ICD-10-CM

## 2013-07-13 DIAGNOSIS — Z8759 Personal history of other complications of pregnancy, childbirth and the puerperium: Secondary | ICD-10-CM | POA: Insufficient documentation

## 2013-07-13 DIAGNOSIS — O09299 Supervision of pregnancy with other poor reproductive or obstetric history, unspecified trimester: Secondary | ICD-10-CM

## 2013-07-13 DIAGNOSIS — O09529 Supervision of elderly multigravida, unspecified trimester: Secondary | ICD-10-CM | POA: Insufficient documentation

## 2013-07-13 DIAGNOSIS — Z3480 Encounter for supervision of other normal pregnancy, unspecified trimester: Secondary | ICD-10-CM

## 2013-07-13 DIAGNOSIS — Z3490 Encounter for supervision of normal pregnancy, unspecified, unspecified trimester: Secondary | ICD-10-CM

## 2013-07-13 DIAGNOSIS — O34219 Maternal care for unspecified type scar from previous cesarean delivery: Secondary | ICD-10-CM | POA: Insufficient documentation

## 2013-07-13 LAB — CBC
HEMATOCRIT: 30.7 % — AB (ref 36.0–46.0)
Hemoglobin: 9.5 g/dL — ABNORMAL LOW (ref 12.0–15.0)
MCH: 22.4 pg — AB (ref 26.0–34.0)
MCHC: 30.9 g/dL (ref 30.0–36.0)
MCV: 72.4 fL — AB (ref 78.0–100.0)
PLATELETS: 267 10*3/uL (ref 150–400)
RBC: 4.24 MIL/uL (ref 3.87–5.11)
RDW: 14.2 % (ref 11.5–15.5)
WBC: 9.7 10*3/uL (ref 4.0–10.5)

## 2013-07-13 LAB — POCT URINALYSIS DIP (DEVICE)
BILIRUBIN URINE: NEGATIVE
GLUCOSE, UA: NEGATIVE mg/dL
HGB URINE DIPSTICK: NEGATIVE
Ketones, ur: NEGATIVE mg/dL
Leukocytes, UA: NEGATIVE
NITRITE: NEGATIVE
PH: 7 (ref 5.0–8.0)
Protein, ur: NEGATIVE mg/dL
SPECIFIC GRAVITY, URINE: 1.015 (ref 1.005–1.030)
UROBILINOGEN UA: 0.2 mg/dL (ref 0.0–1.0)

## 2013-07-13 MED ORDER — TETANUS-DIPHTH-ACELL PERTUSSIS 5-2.5-18.5 LF-MCG/0.5 IM SUSP: 0.5000 mL | Freq: Once | INTRAMUSCULAR | Status: DC

## 2013-07-13 MED ORDER — PRENATAL VITAMINS PLUS 27-1 MG PO TABS: 1.0000 | ORAL_TABLET | Freq: Every day | ORAL | Status: DC

## 2013-07-13 NOTE — Progress Notes (Signed)
Here for initial vist- transferring care from [facility]. New patient information given. Discussed BMI/ appropriate weight gain.

## 2013-07-13 NOTE — Progress Notes (Signed)
New OB. Routines reviewed via interpretor  Subjective:    [name] is a [id number] 9547w3d being seen today for her first obstetrical visit.  Her obstetrical history is significant for advanced maternal age and Late to care, prior C/S x 2, hx stillbirth. Patient does intend to breast feed. Pregnancy history fully reviewed.  Patient reports no complaints.  First delivery was a term stillbirth, vaginal birth.  Had second two deliveries with [facility].  Both were C/S due to poor BPP, oligo and nonreassuring FHR tracings. Had D&C for Missed AB last year there. Saw them in March for New OB and had MFM appt scheduled, but pt states they never told her to go there. So has had no US or labs. Unclear why she was referred here, pt states "they told me to come here".   Filed Vitals:   07/13/13 1304 07/13/13 1317  BP: 128/84   Pulse: 112   Temp: 98.3 F (36.8 C)   Height:  4' 10.5" (1.486 m)  Weight: 68.901 kg (151 lb 14.4 oz)     HISTORY: OB History  Gravida Para Term Preterm AB SAB TAB Ectopic Multiple Living  5 3 3  1 1    2     # Outcome Date GA Lbr Len/2nd Weight Sex Delivery Anes PTL Lv  5 CUR           4 SAB 07/18/12          3 TRM 2012 2662w0d  3.175 kg (7 lb) F [facility]   Y  2 TRM 2011 4143w0d  3.317 kg (7 lb 5 oz) F [facility]   Y  1 TRM 2001    F SVD   SB     Comments: [place] in [place]; baby stillborn      History reviewed. No pertinent past medical history. Past Surgical History  Procedure Laterality Date  . Dilation and evacuation N/A 07/18/2012    Procedure: DILATATION AND EVACUATION;  Surgeon: Geryl RankinsEvelyn Varnado, MD;  Location: [facility];  Service: Gynecology;  Laterality: N/A;  . Cesarean section     History reviewed. No pertinent family history.   Exam    Uterus:  Fundal Height: 28 cm  Pelvic Exam:    Perineum: Not examined, [facility]   Vulva: Not examined   Vagina:  Not examined   pH:    Cervix: not examined   Adnexa: not evaluated   Bony Pelvis: not examined  System:  Breast:  normal appearance, no masses or tenderness   Skin: normal coloration and turgor, no rashes    Neurologic: oriented, grossly non-focal, normal mood   Extremities: normal strength, tone, and muscle mass   HEENT neck supple with midline trachea   Mouth/Teeth mucous membranes moist, pharynx normal without lesions   Neck supple and no masses   Cardiovascular: regular rate and rhythm, no murmurs or gallops   Respiratory:  appears well, vitals normal, no respiratory distress, acyanotic, normal RR, ear and throat exam is normal, neck free of mass or lymphadenopathy, chest clear, no wheezing, crepitations, rhonchi, normal symmetric air entry   Abdomen: soft, non-tender; bowel sounds normal; no masses,  no organomegaly   Urinary: not examined      Assessment:    Pregnancy: [id number] Patient Active Problem List   Diagnosis Date Noted  . History of stillbirth in currently pregnant patient 07/13/2013  . AMA (advanced maternal age) multigravida 35+ 07/13/2013  . Previous cesarean section complicating pregnancy, antepartum condition or  complication 07/13/2013        Plan:     Initial labs drawn. Prenatal vitamins. Problem list reviewed and updated. Genetic Screening discussed Quad Screen: Too late.  Ultrasound discussed; fetal survey: ordered.  Follow up in [facility] in [facility] 50% of 30 min visit spent on counseling and coordination of care.      [name] 07/13/2013

## 2013-07-13 NOTE — Patient Instructions (Signed)
Third Trimester of Pregnancy  The third trimester is from week 29 through week 42, months 7 through 9. The third trimester is a time when the fetus is growing rapidly. At the end of the ninth month, the fetus is about 20 inches in length and weighs 6 10 pounds.   BODY CHANGES  Your body goes through many changes during pregnancy. The changes vary from woman to woman.    Your weight will continue to increase. You can expect to gain 25 35 pounds (11 16 kg) by the end of the pregnancy.   You may begin to get stretch marks on your hips, abdomen, and breasts.   You may urinate more often because the fetus is moving lower into your pelvis and pressing on your bladder.   You may develop or continue to have heartburn as a result of your pregnancy.   You may develop constipation because certain hormones are causing the muscles that push waste through your intestines to slow down.   You may develop hemorrhoids or swollen, bulging veins (varicose veins).   You may have pelvic pain because of the weight gain and pregnancy hormones relaxing your joints between the bones in your pelvis. Back aches may result from over exertion of the muscles supporting your posture.   Your breasts will continue to grow and be tender. A yellow discharge may leak from your breasts called colostrum.   Your belly button may stick out.   You may feel short of breath because of your expanding uterus.   You may notice the fetus "dropping," or moving lower in your abdomen.   You may have a bloody mucus discharge. This usually occurs a few days to a week before labor begins.   Your cervix becomes thin and soft (effaced) near your due date.  WHAT TO EXPECT AT YOUR PRENATAL EXAMS   You will have prenatal exams every 2 weeks until week 36. Then, you will have weekly prenatal exams. During a routine prenatal visit:   You will be weighed to make sure you and the fetus are growing normally.   Your blood pressure is taken.   Your abdomen will be  measured to track your baby's growth.   The fetal heartbeat will be listened to.   Any test results from the previous visit will be discussed.   You may have a cervical check near your due date to see if you have effaced.  At around 36 weeks, your caregiver will check your cervix. At the same time, your caregiver will also perform a test on the secretions of the vaginal tissue. This test is to determine if a type of bacteria, Group B streptococcus, is present. Your caregiver will explain this further.  Your caregiver may ask you:   What your birth plan is.   How you are feeling.   If you are feeling the baby move.   If you have had any abnormal symptoms, such as leaking fluid, bleeding, severe headaches, or abdominal cramping.   If you have any questions.  Other tests or screenings that may be performed during your third trimester include:   Blood tests that check for low iron levels (anemia).   Fetal testing to check the health, activity level, and growth of the fetus. Testing is done if you have certain medical conditions or if there are problems during the pregnancy.  FALSE LABOR  You may feel small, irregular contractions that eventually go away. These are called Braxton Hicks contractions, or   false labor. Contractions may last for hours, days, or even weeks before true labor sets in. If contractions come at regular intervals, intensify, or become painful, it is best to be seen by your caregiver.   SIGNS OF LABOR    Menstrual-like cramps.   Contractions that are 5 minutes apart or less.   Contractions that start on the top of the uterus and spread down to the lower abdomen and back.   A sense of increased pelvic pressure or back pain.   A watery or bloody mucus discharge that comes from the vagina.  If you have any of these signs before the 37th week of pregnancy, call your caregiver right away. You need to go to the hospital to get checked immediately.  HOME CARE INSTRUCTIONS    Avoid all  smoking, herbs, alcohol, and unprescribed drugs. These chemicals affect the formation and growth of the baby.   Follow your caregiver's instructions regarding medicine use. There are medicines that are either safe or unsafe to take during pregnancy.   Exercise only as directed by your caregiver. Experiencing uterine cramps is a good sign to stop exercising.   Continue to eat regular, healthy meals.   Wear a good support bra for breast tenderness.   Do not use hot tubs, steam rooms, or saunas.   Wear your seat belt at all times when driving.   Avoid raw meat, uncooked cheese, cat litter boxes, and soil used by cats. These carry germs that can cause birth defects in the baby.   Take your prenatal vitamins.   Try taking a stool softener (if your caregiver approves) if you develop constipation. Eat more high-fiber foods, such as fresh vegetables or fruit and whole grains. Drink plenty of fluids to keep your urine clear or pale yellow.   Take warm sitz baths to soothe any pain or discomfort caused by hemorrhoids. Use hemorrhoid cream if your caregiver approves.   If you develop varicose veins, wear support hose. Elevate your feet for 15 minutes, 3 4 times a day. Limit salt in your diet.   Avoid heavy lifting, wear low heal shoes, and practice good posture.   Rest a lot with your legs elevated if you have leg cramps or low back pain.   Visit your dentist if you have not gone during your pregnancy. Use a soft toothbrush to brush your teeth and be gentle when you floss.   A sexual relationship may be continued unless your caregiver directs you otherwise.   Do not travel far distances unless it is absolutely necessary and only with the approval of your caregiver.   Take prenatal classes to understand, practice, and ask questions about the labor and delivery.   Make a trial run to the hospital.   Pack your hospital bag.   Prepare the baby's nursery.   Continue to go to all your prenatal visits as directed  by your caregiver.  SEEK MEDICAL CARE IF:   You are unsure if you are in labor or if your water has broken.   You have dizziness.   You have mild pelvic cramps, pelvic pressure, or nagging pain in your abdominal area.   You have persistent nausea, vomiting, or diarrhea.   You have a bad smelling vaginal discharge.   You have pain with urination.  SEEK IMMEDIATE MEDICAL CARE IF:    You have a fever.   You are leaking fluid from your vagina.   You have spotting or bleeding from your vagina.     You have severe abdominal cramping or pain.   You have rapid weight loss or gain.   You have shortness of breath with chest pain.   You notice sudden or extreme swelling of your face, hands, ankles, feet, or legs.   You have not felt your baby move in over an hour.   You have severe headaches that do not go away with medicine.   You have vision changes.  Document Released: 02/26/2001 Document Revised: 11/04/2012 Document Reviewed: 05/05/2012  [employer] Patient Information 2014 [employer].

## 2013-07-13 NOTE — Progress Notes (Signed)
Called over to [facility] to obtain all lab work (missing CBC, Hep B, Rubella). Paperwork faxed over-- the same paperwork when referred. Missing lab work had not been drawn and will need to be drawn at next visit.

## 2013-07-14 ENCOUNTER — Encounter: Payer: Self-pay | Admitting: Advanced Practice Midwife

## 2013-07-14 LAB — GLUCOSE TOLERANCE, 1 HOUR (50G) W/O FASTING: Glucose, 1 Hour GTT: 91 mg/dL (ref 70–140)

## 2013-07-14 LAB — RPR

## 2013-07-14 LAB — HIV ANTIBODY (ROUTINE TESTING W REFLEX): HIV 1&2 Ab, 4th Generation: NONREACTIVE

## 2013-07-16 ENCOUNTER — Ambulatory Visit (HOSPITAL_COMMUNITY)
Admission: RE | Admit: 2013-07-16 | Discharge: 2013-07-16 | Disposition: A | Discharge status: Home / Self Care | Payer: Medicaid Other | Source: Ambulatory Visit | Attending: Advanced Practice Midwife | Admitting: Advanced Practice Midwife

## 2013-07-16 DIAGNOSIS — O09299 Supervision of pregnancy with other poor reproductive or obstetric history, unspecified trimester: Secondary | ICD-10-CM | POA: Insufficient documentation

## 2013-07-16 DIAGNOSIS — O09529 Supervision of elderly multigravida, unspecified trimester: Secondary | ICD-10-CM | POA: Insufficient documentation

## 2013-07-16 DIAGNOSIS — O34219 Maternal care for unspecified type scar from previous cesarean delivery: Secondary | ICD-10-CM | POA: Insufficient documentation

## 2013-07-21 ENCOUNTER — Encounter (HOSPITAL_COMMUNITY): Payer: Self-pay | Admitting: Advanced Practice Midwife

## 2013-07-29 ENCOUNTER — Ambulatory Visit (INDEPENDENT_AMBULATORY_CARE_PROVIDER_SITE_OTHER): Payer: Medicaid Other | Admitting: Obstetrics & Gynecology

## 2013-07-29 VITALS — BP 118/83 | HR 96 | Temp 97.0°F | Wt 154.0 lb

## 2013-07-29 DIAGNOSIS — O9989 Other specified diseases and conditions complicating pregnancy, childbirth and the puerperium: Secondary | ICD-10-CM

## 2013-07-29 DIAGNOSIS — O09299 Supervision of pregnancy with other poor reproductive or obstetric history, unspecified trimester: Secondary | ICD-10-CM

## 2013-07-29 DIAGNOSIS — Z2839 Other underimmunization status: Secondary | ICD-10-CM

## 2013-07-29 DIAGNOSIS — Z283 Underimmunization status: Secondary | ICD-10-CM

## 2013-07-29 DIAGNOSIS — O09529 Supervision of elderly multigravida, unspecified trimester: Secondary | ICD-10-CM

## 2013-07-29 DIAGNOSIS — O09899 Supervision of other high risk pregnancies, unspecified trimester: Secondary | ICD-10-CM

## 2013-07-29 LAB — CBC
HCT: 31.1 % — ABNORMAL LOW (ref 36.0–46.0)
Hemoglobin: 9.8 g/dL — ABNORMAL LOW (ref 12.0–15.0)
MCH: 23.2 pg — AB (ref 26.0–34.0)
MCHC: 31.5 g/dL (ref 30.0–36.0)
MCV: 73.5 fL — AB (ref 78.0–100.0)
PLATELETS: 214 10*3/uL (ref 150–400)
RBC: 4.23 MIL/uL (ref 3.87–5.11)
RDW: 14.3 % (ref 11.5–15.5)
WBC: 9.2 10*3/uL (ref 4.0–10.5)

## 2013-07-29 LAB — POCT URINALYSIS DIP (DEVICE)
BILIRUBIN URINE: NEGATIVE
Glucose, UA: NEGATIVE mg/dL
HGB URINE DIPSTICK: NEGATIVE
Ketones, ur: NEGATIVE mg/dL
LEUKOCYTES UA: NEGATIVE
NITRITE: NEGATIVE
PH: 7 (ref 5.0–8.0)
PROTEIN: NEGATIVE mg/dL
Specific Gravity, Urine: 1.015 (ref 1.005–1.030)
UROBILINOGEN UA: 0.2 mg/dL (ref 0.0–1.0)

## 2013-07-29 NOTE — Progress Notes (Signed)
Still considering BTL at [facility].

## 2013-07-30 LAB — HEPATITIS B SURFACE ANTIGEN: Hepatitis B Surface Ag: NEGATIVE

## 2013-08-03 LAB — RUBELLA ANTIBODY, IGM: RUBELLA: 0.26 (ref <0.90)

## 2013-08-10 DIAGNOSIS — O09899 Supervision of other high risk pregnancies, unspecified trimester: Secondary | ICD-10-CM | POA: Insufficient documentation

## 2013-08-10 DIAGNOSIS — O9989 Other specified diseases and conditions complicating pregnancy, childbirth and the puerperium: Secondary | ICD-10-CM

## 2013-08-10 DIAGNOSIS — Z283 Underimmunization status: Secondary | ICD-10-CM | POA: Insufficient documentation

## 2013-08-12 ENCOUNTER — Ambulatory Visit (INDEPENDENT_AMBULATORY_CARE_PROVIDER_SITE_OTHER): Payer: Medicaid Other | Admitting: Family

## 2013-08-12 VITALS — BP 112/75 | HR 93 | Temp 97.3°F | Wt 155.6 lb

## 2013-08-12 DIAGNOSIS — O34219 Maternal care for unspecified type scar from previous cesarean delivery: Secondary | ICD-10-CM

## 2013-08-12 DIAGNOSIS — O09299 Supervision of pregnancy with other poor reproductive or obstetric history, unspecified trimester: Secondary | ICD-10-CM

## 2013-08-12 DIAGNOSIS — O09529 Supervision of elderly multigravida, unspecified trimester: Secondary | ICD-10-CM

## 2013-08-12 LAB — POCT URINALYSIS DIP (DEVICE)
Bilirubin Urine: NEGATIVE
Glucose, UA: NEGATIVE mg/dL
Hgb urine dipstick: NEGATIVE
KETONES UR: NEGATIVE mg/dL
LEUKOCYTES UA: NEGATIVE
NITRITE: NEGATIVE
PH: 7 (ref 5.0–8.0)
PROTEIN: NEGATIVE mg/dL
Specific Gravity, Urine: 1.015 (ref 1.005–1.030)
UROBILINOGEN UA: 0.2 mg/dL (ref 0.0–1.0)

## 2013-08-12 NOTE — Progress Notes (Signed)
U/S scheduled 08/13/13 at 845 am.

## 2013-08-12 NOTE — Progress Notes (Signed)
Pt does not desire BTL and will do a repeat csection after learning of risk or rupture.  Schedule growth ultrasound.  Explained to patient will begin fetal testing next week.

## 2013-08-13 ENCOUNTER — Ambulatory Visit (HOSPITAL_COMMUNITY)
Admission: RE | Admit: 2013-08-13 | Discharge: 2013-08-13 | Disposition: A | Discharge status: Home / Self Care | Payer: Medicaid Other | Source: Ambulatory Visit | Attending: Family | Admitting: Family

## 2013-08-13 DIAGNOSIS — Z3689 Encounter for other specified antenatal screening: Secondary | ICD-10-CM | POA: Insufficient documentation

## 2013-08-13 DIAGNOSIS — O09299 Supervision of pregnancy with other poor reproductive or obstetric history, unspecified trimester: Secondary | ICD-10-CM

## 2013-08-17 ENCOUNTER — Ambulatory Visit (INDEPENDENT_AMBULATORY_CARE_PROVIDER_SITE_OTHER): Payer: Medicaid Other | Admitting: *Deleted

## 2013-08-17 ENCOUNTER — Encounter: Payer: Self-pay | Admitting: *Deleted

## 2013-08-17 VITALS — BP 115/77 | HR 98 | Wt 155.1 lb

## 2013-08-17 DIAGNOSIS — O09299 Supervision of pregnancy with other poor reproductive or obstetric history, unspecified trimester: Secondary | ICD-10-CM

## 2013-08-17 LAB — FETAL NONSTRESS TEST

## 2013-08-17 NOTE — Progress Notes (Addendum)
NST 08/17/13 reactive, reviewed paper copy

## 2013-08-19 ENCOUNTER — Ambulatory Visit (INDEPENDENT_AMBULATORY_CARE_PROVIDER_SITE_OTHER): Payer: Medicaid Other | Admitting: Obstetrics & Gynecology

## 2013-08-19 ENCOUNTER — Encounter: Payer: Self-pay | Admitting: *Deleted

## 2013-08-19 VITALS — BP 118/66 | HR 93 | Wt 154.4 lb

## 2013-08-19 DIAGNOSIS — O09299 Supervision of pregnancy with other poor reproductive or obstetric history, unspecified trimester: Secondary | ICD-10-CM

## 2013-08-19 LAB — POCT URINALYSIS DIP (DEVICE)
BILIRUBIN URINE: NEGATIVE
Glucose, UA: NEGATIVE mg/dL
HGB URINE DIPSTICK: NEGATIVE
KETONES UR: NEGATIVE mg/dL
Leukocytes, UA: NEGATIVE
NITRITE: NEGATIVE
PH: 7 (ref 5.0–8.0)
PROTEIN: NEGATIVE mg/dL
Specific Gravity, Urine: 1.02 (ref 1.005–1.030)
Urobilinogen, UA: 0.2 mg/dL (ref 0.0–1.0)

## 2013-08-19 NOTE — Patient Instructions (Signed)
Cesarean Delivery °Care After °Refer to this sheet in the next few weeks. These instructions provide you with information on caring for yourself after your procedure. Your health care provider may also give you specific instructions. Your treatment has been planned according to current medical practices, but problems sometimes occur. Call your health care provider if you have any problems or questions after you go home. °HOME CARE INSTRUCTIONS  °· Only take over-the-counter or prescription medications as directed by your health care provider. °· Do not drink alcohol, especially if you are breastfeeding or taking medication to relieve pain. °· Do not chew or smoke tobacco. °· Continue to use good perineal care. Good perineal care includes: °· Wiping your perineum from front to back. °· Keeping your perineum clean. °· Check your surgical cut (incision) daily for increased redness, drainage, swelling, or separation of skin. °· Clean your incision gently with soap and water every day, and then pat it dry. If your health care provider says it is OK, leave the incision uncovered. Use a bandage (dressing) if the incision is draining fluid or appears irritated. If the adhesive strips across the incision do not fall off within 7 days, carefully peel them off. °· Hug a pillow when coughing or sneezing until your incision is healed. This helps to relieve pain. °· Do not use tampons or douche until your health care provider says it is okay. °· Shower, wash your hair, and take tub baths as directed by your health care provider. °· Wear a well-fitting bra that provides breast support. °· Limit wearing support panties or control-top hose. °· Drink enough fluids to keep your urine clear or pale yellow. °· Eat high-fiber foods such as whole grain cereals and breads, brown rice, beans, and fresh fruits and vegetables every day. These foods may help prevent or relieve constipation. °· Resume activities such as climbing stairs,  driving, lifting, exercising, or traveling as directed by your health care provider. °· Talk to your health care provider about resuming sexual activities. This is dependent upon your risk of infection, your rate of healing, and your comfort and desire to resume sexual activity. °· Try to have someone help you with your household activities and your newborn for at least a few days after you leave the hospital. °· Rest as much as possible. Try to rest or take a nap when your newborn is sleeping. °· Increase your activities gradually. °· Keep all of your scheduled postpartum appointments. It is very important to keep your scheduled follow-up appointments. At these appointments, your health care provider will be checking to make sure that you are healing physically and emotionally. °SEEK MEDICAL CARE IF:  °· You are passing large clots from your vagina. Save any clots to show your health care provider. °· You have a foul smelling discharge from your vagina. °· You have trouble urinating. °· You are urinating frequently. °· You have pain when you urinate. °· You have a change in your bowel movements. °· You have increasing redness, pain, or swelling near your incision. °· You have pus draining from your incision. °· Your incision is separating. °· You have painful, hard, or reddened breasts. °· You have a severe headache. °· You have blurred vision or see spots. °· You feel sad or depressed. °· You have thoughts of hurting yourself or your newborn. °· You have questions about your care, the care of your newborn, or medications. °· You are dizzy or lightheaded. °· You have a rash. °· You   have pain, redness, or swelling at the site of the removed intravenous access (IV) tube.  You have nausea or vomiting.  You stopped breastfeeding and have not had a menstrual period within 12 weeks of stopping.  You are not breastfeeding and have not had a menstrual period within 12 weeks of delivery.  You have a fever. SEEK  IMMEDIATE MEDICAL CARE IF:  You have persistent pain.  You have chest pain. You have shortness of breath. Thng Tin v? Tri?t S?n, N? (Sterilization Information, Female) Tri?t s?n n? l m?t th? thu?t ng?a [place] v?nh vi?n. C nhi?u cch khc nhau ?? th?c hi?n tri?t s?n, nh?ng t?t c? ??u ch?n ho?c ?ng kn ?ng d?n tr?ng ??n n?i tr?ng khng th? ??n ???c t? cung. N?u tr?ng khng th? ??n t? cung, tinh trng khng th? th? tinh cho tr?ng v b?n khng th? c [place]. Tri?t s?n ???c th?c hi?n b?ng th? thu?t ngo?i khoa. ?i khi cc th? thu?t ny ???c th?c hi?n trong b?nh vi?n trong khi b?nh nhn ng?. ?i khi, c th? ???c th?c hi?n trong mi tr??ng phng khm khi b?nh nhn t?nh to. ?ng d?n tr?ng c th? ???c ph?u thu?t ?? c?t b?, th?t ho?c dn kn thng qua m?t th? thu?t c tn l th?t ?ng d?n tr?ng. ?ng d?n tr?ng c?ng c th? ???c k?p kn l?i b?ng k?p ho?c vng. Tri?t s?n c?ng c th? ???c th?c hi?n b?ng cch ??t m?t cu?n dy nh? vo m?i ?ng d?n tr?ng ?? t?o ra s?o pht tri?n vo bn trong ?ng. Sau ?, m s?o s? ch?n cc ?ng. Hy th?o lu?n v? v?n ?? tri?t s?n v?i chuyn gia ch?m [place] s?c kh?e ?? ???c tr? l?i v? b?t k? m?i quan tm no m b?n ho?c b?n ??i c?a b?n c th? c. B?n c th? mu?n h?i v? lo?i tri?t s?n no chuyn gia ch?m [place] s?c kh?e s? th?c hi?n. M?t s? chuyn gia ch?m [place] s?c kh?e c th? khng th?c hi?n t?t c? cc l?a ch?n khc nhau. Tri?t s?n l v?nh vi?n v ch? nn ???c th?c hi?n n?u b?n ch?c ch?n khng mu?n sinh con ho?c khng mu?n sinh thm con. B? tri?t s?n c th? khng thnh cng.  TH? THU?T TRI?T S?N Tri?t s?n b?ng n?i soi. ?y l ph??ng php ph?u thu?t ???c th?c hi?n t?i m?t th?i ?i?m khng ph?i ngay sau khi sinh. Hai v?t m? ???c r?ch trong b?ng d??i. M?t ?ng m?ng, ???c chi?u sng ( ?ng soi ? b?ng) ???c ??a vo m?t trong hai v?t m? v ???c s? d?ng ?? th?c hi?n th? thu?t. ?ng d?n tr?ng ???c ?ng l?i b?ng vng ho?c k?p. M?t d?ng c? s? d?ng nhi?t c th? ???c s? d?ng ?? dn kn cc ?ng cho ?ng l?i ( ??t  ?i?n). Ph?u thu?t m? b?ng nh?. ?y l ph??ng php ph?u thu?t ???c th?c hi?n 1 ho?c 2 ngy sau khi sinh. Thng th??ng, m?t v?t m? nh? ???c r?ch ngay bn d??i r?n v cc ?ng d?n tr?ng s? l? ra. Sau ? cc ?ng c th? ???c dn kn, th?t ho?c c?t. Tri?t s?n b?ng soi t? cung. Ph??ng php ny ???c th?c hi?n t?i m?t th?i ?i?m khng ph?i ngay sau khi sinh. M?t cu?n dy nh?, gi?ng nh? l xo ???c ??a qua c? t? cung v t? cung v ???c ??t vo cc ?ng d?n tr?ng. Cu?n dy ny t?o ra s?o v ch?n cc ?ng. Nn s? d?ng cc hnh th?c ng?a thai khc trong  3 thng sau khi lm th? thu?t ?? cho php m s?o hnh thnh ??y ??Aleen Sells. Ngoi ra, c?n ch?p [place] vi t? cung 3 thng sau ? ?? ??m b?o th? thu?t ? thnh cng. Ch?p [place] vi tr?ng-t? cung l m?t th? thu?t s? d?ng [place] ?? soi t? cung v ?ng d?n tr?ng sau khi m?t ch?t ?? lm cho chng hi?n th? t?t h?n ? ???c ??a vo. TRI?T S?N C AN TON KHNG? Tri?t s?n ???c xem l an ton, r?t hi?m khi c bi?n ch?ng. Nguy c? ph? thu?c vo ki?u th? thu?t p d?ng cho b?n. Gi?ng nh? v?i b?t k? th? thu?t ngo?i khoa no, lun c nh?ng nguy c?. M?t s? nguy c? c?a qu trnh tri?t s?n theo b?t k? cch no bao g?m: Ch?y mu. Nhi?m trng. Ph?n ?ng v?i thu?c gy m. T?n th??ng xung quanh cc c? quan. Nguy c? c? th? khi ??t cu?n dy soi t? cung bao g?m: Cc cu?n dy c th? khng ???c ??t chnh xc ngay l?n ??u. Cc cu?n dy c th? di chuy?n ra kh?i v? tr. Cc ?ng c th? khng ???c ch?n hon ton sau 3 thng. T?n th??ng cc c? quan xung quanh khi ??t cu?n dy. TRI?T S?N N? C HI?U QU? NH? TH? NO? Tri?t s?n c hi?u qu? g?n nh? 100%, nh?ng c th? khng thnh cng. Ty thu?c vo lo?i tri?t s?n, t? l? khng thnh cng c th? ln t?i 3%. Sau khi tri?t s?n b?ng n?i soi t? cung b?ng ??t cu?n dy vo cc ?ng d?n tr?ng, b?n s? c?n trnh [place] d? phng trong 3 thng sau khi ph?u thu?t. Tri?t s?n c tc d?ng su?t c? ??i. L?I CH C?A TRI?T S?N N khng ?nh h??ng ??n hocmon c?a b?n v do ? s? khng ?nh  h??ng ??n chu k? kinh nguy?t, ham mu?n ho?c hi?u su?t quan h? tnh d?c c?a b?n. Tri?t s?n c tc d?ng su?t c? ??i. Tri?t s?n l an ton. B?n khng c?n ph?i lo l?ng v? vi?c c [place]. Hy nh? r?ng n?u b?n ???c lm th? thu?t ??t b?ng soi t? cung, b?n ph?i ch? 3 thng sau khi ph?u thu?t (ho?c cho ??n khi chuyn gia ch?m [place] s?c kh?e kh?ng ??nh) tr??c khi vi?c mang thai ???c xem nh? khng th?. Khng c tc d?ng ph?, khng gi?ng nh? cc lo?i ki?m sot sinh s?n (ng?a [place]) khc. NH??C ?I?M C?A TRI?T S?N B?n ph?i ch?c ch?n khng mu?n sinh con ho?c sinh thm con. Th? thu?t ny l v?nh vi?n. N khng phng ng?a ???c cc b?nh ly truy?n qua ???ng tnh d?c ([facility]). Cc ?ng c th? pht tri?n tr? l?i cng nhau. N?u ?i?u ny x?y ra, s? c nguy c? mang [place]. C?ng c nguy c? gia t?ng (50%) mang thai ngoi t? cung. ?y l tr??ng h?p mang thai bn ngoi t? cung. Document Released: 11/27/2011 Document Revised: 11/04/2012 [facility] Patient Information 2014 [place] ManorExitCare, MarylandLLC.    You faint.  You have leg pain.  You have stomach pain.  Your vaginal bleeding saturates 2 or more sanitary pads in 1 hour. MAKE SURE YOU:   Understand these instructions.  Will watch your condition.  Will get help right away if you are not doing well or get worse. Document Released: 11/24/2001 Document Revised: 11/04/2012 Document Reviewed: 10/30/2011 [facility] Patient Information 2014 [place], [place]LL[place].

## 2013-08-19 NOTE — Progress Notes (Signed)
NST reactive , good fetal movement. Will recosider having BTL at [facility].

## 2013-08-24 ENCOUNTER — Ambulatory Visit (INDEPENDENT_AMBULATORY_CARE_PROVIDER_SITE_OTHER): Payer: Medicaid Other | Admitting: *Deleted

## 2013-08-24 VITALS — BP 122/74 | HR 102

## 2013-08-24 DIAGNOSIS — O09299 Supervision of pregnancy with other poor reproductive or obstetric history, unspecified trimester: Secondary | ICD-10-CM

## 2013-08-24 LAB — US OB FOLLOW UP

## 2013-08-24 NOTE — Progress Notes (Signed)
NST reviewed and reactive.  

## 2013-08-27 ENCOUNTER — Ambulatory Visit (INDEPENDENT_AMBULATORY_CARE_PROVIDER_SITE_OTHER): Payer: Medicaid Other | Admitting: Advanced Practice Midwife

## 2013-08-27 VITALS — BP 111/68 | HR 101 | Wt 158.7 lb

## 2013-08-27 DIAGNOSIS — O09299 Supervision of pregnancy with other poor reproductive or obstetric history, unspecified trimester: Secondary | ICD-10-CM

## 2013-08-27 NOTE — Progress Notes (Signed)
NST reactive    Asking about having a vaginal birth. DIscussed risk of uterine rupture is higher with 2 prior c/s, though patient did have one vaginal birth with first baby (stillbirth).

## 2013-08-27 NOTE — Patient Instructions (Signed)
Third Trimester of Pregnancy  The third trimester is from week 29 through week 42, months 7 through 9. The third trimester is a time when the fetus is growing rapidly. At the end of the ninth month, the fetus is about 20 inches in length and weighs 6 10 pounds.   BODY CHANGES  Your body goes through many changes during pregnancy. The changes vary from woman to woman.    Your weight will continue to increase. You can expect to gain 25 35 pounds (11 16 kg) by the end of the pregnancy.   You may begin to get stretch marks on your hips, abdomen, and breasts.   You may urinate more often because the fetus is moving lower into your pelvis and pressing on your bladder.   You may develop or continue to have heartburn as a result of your pregnancy.   You may develop constipation because certain hormones are causing the muscles that push waste through your intestines to slow down.   You may develop hemorrhoids or swollen, bulging veins (varicose veins).   You may have pelvic pain because of the weight gain and pregnancy hormones relaxing your joints between the bones in your pelvis. Back aches may result from over exertion of the muscles supporting your posture.   Your breasts will continue to grow and be tender. A yellow discharge may leak from your breasts called colostrum.   Your belly button may stick out.   You may feel short of breath because of your expanding uterus.   You may notice the fetus "dropping," or moving lower in your abdomen.   You may have a bloody mucus discharge. This usually occurs a few days to a week before labor begins.   Your cervix becomes thin and soft (effaced) near your due date.  WHAT TO EXPECT AT YOUR PRENATAL EXAMS   You will have prenatal exams every 2 weeks until week 36. Then, you will have weekly prenatal exams. During a routine prenatal visit:   You will be weighed to make sure you and the fetus are growing normally.   Your blood pressure is taken.   Your abdomen will be  measured to track your baby's growth.   The fetal heartbeat will be listened to.   Any test results from the previous visit will be discussed.   You may have a cervical check near your due date to see if you have effaced.  At around 36 weeks, your caregiver will check your cervix. At the same time, your caregiver will also perform a test on the secretions of the vaginal tissue. This test is to determine if a type of bacteria, Group B streptococcus, is present. Your caregiver will explain this further.  Your caregiver may ask you:   What your birth plan is.   How you are feeling.   If you are feeling the baby move.   If you have had any abnormal symptoms, such as leaking fluid, bleeding, severe headaches, or abdominal cramping.   If you have any questions.  Other tests or screenings that may be performed during your third trimester include:   Blood tests that check for low iron levels (anemia).   Fetal testing to check the health, activity level, and growth of the fetus. Testing is done if you have certain medical conditions or if there are problems during the pregnancy.  FALSE LABOR  You may feel small, irregular contractions that eventually go away. These are called Braxton Hicks contractions, or   false labor. Contractions may last for hours, days, or even weeks before true labor sets in. If contractions come at regular intervals, intensify, or become painful, it is best to be seen by your caregiver.   SIGNS OF LABOR    Menstrual-like cramps.   Contractions that are 5 minutes apart or less.   Contractions that start on the top of the uterus and spread down to the lower abdomen and back.   A sense of increased pelvic pressure or back pain.   A watery or bloody mucus discharge that comes from the vagina.  If you have any of these signs before the 37th week of pregnancy, call your caregiver right away. You need to go to the hospital to get checked immediately.  HOME CARE INSTRUCTIONS    Avoid all  smoking, herbs, alcohol, and unprescribed drugs. These chemicals affect the formation and growth of the baby.   Follow your caregiver's instructions regarding medicine use. There are medicines that are either safe or unsafe to take during pregnancy.   Exercise only as directed by your caregiver. Experiencing uterine cramps is a good sign to stop exercising.   Continue to eat regular, healthy meals.   Wear a good support bra for breast tenderness.   Do not use hot tubs, steam rooms, or saunas.   Wear your seat belt at all times when driving.   Avoid raw meat, uncooked cheese, cat litter boxes, and soil used by cats. These carry germs that can cause birth defects in the baby.   Take your prenatal vitamins.   Try taking a stool softener (if your caregiver approves) if you develop constipation. Eat more high-fiber foods, such as fresh vegetables or fruit and whole grains. Drink plenty of fluids to keep your urine clear or pale yellow.   Take warm sitz baths to soothe any pain or discomfort caused by hemorrhoids. Use hemorrhoid cream if your caregiver approves.   If you develop varicose veins, wear support hose. Elevate your feet for 15 minutes, 3 4 times a day. Limit salt in your diet.   Avoid heavy lifting, wear low heal shoes, and practice good posture.   Rest a lot with your legs elevated if you have leg cramps or low back pain.   Visit your dentist if you have not gone during your pregnancy. Use a soft toothbrush to brush your teeth and be gentle when you floss.   A sexual relationship may be continued unless your caregiver directs you otherwise.   Do not travel far distances unless it is absolutely necessary and only with the approval of your caregiver.   Take prenatal classes to understand, practice, and ask questions about the labor and delivery.   Make a trial run to the hospital.   Pack your hospital bag.   Prepare the baby's nursery.   Continue to go to all your prenatal visits as directed  by your caregiver.  SEEK MEDICAL CARE IF:   You are unsure if you are in labor or if your water has broken.   You have dizziness.   You have mild pelvic cramps, pelvic pressure, or nagging pain in your abdominal area.   You have persistent nausea, vomiting, or diarrhea.   You have a bad smelling vaginal discharge.   You have pain with urination.  SEEK IMMEDIATE MEDICAL CARE IF:    You have a fever.   You are leaking fluid from your vagina.   You have spotting or bleeding from your vagina.     You have severe abdominal cramping or pain.   You have rapid weight loss or gain.   You have shortness of breath with chest pain.   You notice sudden or extreme swelling of your face, hands, ankles, feet, or legs.   You have not felt your baby move in over an hour.   You have severe headaches that do not go away with medicine.   You have vision changes.  Document Released: 02/26/2001 Document Revised: 11/04/2012 Document Reviewed: 05/05/2012  [employer] Patient Information 2014 [employer].

## 2013-08-31 ENCOUNTER — Ambulatory Visit (INDEPENDENT_AMBULATORY_CARE_PROVIDER_SITE_OTHER): Payer: Medicaid Other | Admitting: *Deleted

## 2013-08-31 VITALS — BP 107/68 | HR 97

## 2013-08-31 DIAGNOSIS — O09299 Supervision of pregnancy with other poor reproductive or obstetric history, unspecified trimester: Secondary | ICD-10-CM

## 2013-08-31 LAB — US OB FOLLOW UP

## 2013-08-31 NOTE — Progress Notes (Signed)
NST performed today was reviewed and was found to be reactive. AFI normal at 14.2 cm. Continue recommended antenatal testing and prenatal care.

## 2013-09-03 ENCOUNTER — Ambulatory Visit (INDEPENDENT_AMBULATORY_CARE_PROVIDER_SITE_OTHER): Payer: Medicaid Other | Admitting: Family

## 2013-09-03 VITALS — BP 115/79 | HR 99 | Wt 157.7 lb

## 2013-09-03 DIAGNOSIS — O09299 Supervision of pregnancy with other poor reproductive or obstetric history, unspecified trimester: Secondary | ICD-10-CM

## 2013-09-03 DIAGNOSIS — O09293 Supervision of pregnancy with other poor reproductive or obstetric history, third trimester: Secondary | ICD-10-CM

## 2013-09-03 LAB — POCT URINALYSIS DIP (DEVICE)
Bilirubin Urine: NEGATIVE
Glucose, UA: NEGATIVE mg/dL
Hgb urine dipstick: NEGATIVE
KETONES UR: NEGATIVE mg/dL
Leukocytes, UA: NEGATIVE
Nitrite: NEGATIVE
PH: 6 (ref 5.0–8.0)
PROTEIN: NEGATIVE mg/dL
UROBILINOGEN UA: 0.2 mg/dL (ref 0.0–1.0)

## 2013-09-03 NOTE — Progress Notes (Signed)
NST reviewed Category I tracing.   No questions or concerns.  Pt emphasized no student learners for csection.  Urine results not available at discharge.

## 2013-09-06 ENCOUNTER — Ambulatory Visit (INDEPENDENT_AMBULATORY_CARE_PROVIDER_SITE_OTHER): Payer: Medicaid Other | Admitting: *Deleted

## 2013-09-06 DIAGNOSIS — O09293 Supervision of pregnancy with other poor reproductive or obstetric history, third trimester: Secondary | ICD-10-CM

## 2013-09-06 DIAGNOSIS — O09299 Supervision of pregnancy with other poor reproductive or obstetric history, unspecified trimester: Secondary | ICD-10-CM

## 2013-09-06 LAB — US OB FOLLOW UP

## 2013-09-10 ENCOUNTER — Encounter: Payer: Self-pay | Admitting: Obstetrics and Gynecology

## 2013-09-10 ENCOUNTER — Ambulatory Visit (INDEPENDENT_AMBULATORY_CARE_PROVIDER_SITE_OTHER): Payer: Medicaid Other | Admitting: Obstetrics and Gynecology

## 2013-09-10 VITALS — BP 105/68 | HR 87

## 2013-09-10 DIAGNOSIS — Z283 Underimmunization status: Secondary | ICD-10-CM

## 2013-09-10 DIAGNOSIS — O09293 Supervision of pregnancy with other poor reproductive or obstetric history, third trimester: Secondary | ICD-10-CM

## 2013-09-10 DIAGNOSIS — Z2839 Other underimmunization status: Secondary | ICD-10-CM

## 2013-09-10 DIAGNOSIS — O09299 Supervision of pregnancy with other poor reproductive or obstetric history, unspecified trimester: Secondary | ICD-10-CM

## 2013-09-10 DIAGNOSIS — O9989 Other specified diseases and conditions complicating pregnancy, childbirth and the puerperium: Secondary | ICD-10-CM

## 2013-09-10 DIAGNOSIS — O09899 Supervision of other high risk pregnancies, unspecified trimester: Secondary | ICD-10-CM

## 2013-09-10 DIAGNOSIS — O34219 Maternal care for unspecified type scar from previous cesarean delivery: Secondary | ICD-10-CM

## 2013-09-10 DIAGNOSIS — O09529 Supervision of elderly multigravida, unspecified trimester: Secondary | ICD-10-CM

## 2013-09-10 DIAGNOSIS — O09523 Supervision of elderly multigravida, third trimester: Secondary | ICD-10-CM

## 2013-09-10 LAB — POCT URINALYSIS DIP (DEVICE)
BILIRUBIN URINE: NEGATIVE
GLUCOSE, UA: NEGATIVE mg/dL
HGB URINE DIPSTICK: NEGATIVE
Ketones, ur: NEGATIVE mg/dL
NITRITE: NEGATIVE
Protein, ur: NEGATIVE mg/dL
Specific Gravity, Urine: 1.015 (ref 1.005–1.030)
Urobilinogen, UA: 0.2 mg/dL (ref 0.0–1.0)
pH: 6.5 (ref 5.0–8.0)

## 2013-09-10 NOTE — Progress Notes (Signed)
Patient is doing well without complaints. FM/labor precautions reviewed. Cultures collected NST reviewed and reactive

## 2013-09-11 LAB — GC/CHLAMYDIA PROBE AMP
CT Probe RNA: NEGATIVE
GC PROBE AMP APTIMA: NEGATIVE

## 2013-09-12 LAB — CULTURE, BETA STREP (GROUP B ONLY)

## 2013-09-13 ENCOUNTER — Ambulatory Visit (INDEPENDENT_AMBULATORY_CARE_PROVIDER_SITE_OTHER): Payer: Medicaid Other | Admitting: *Deleted

## 2013-09-13 ENCOUNTER — Encounter: Payer: Self-pay | Admitting: Obstetrics and Gynecology

## 2013-09-13 DIAGNOSIS — O09299 Supervision of pregnancy with other poor reproductive or obstetric history, unspecified trimester: Secondary | ICD-10-CM

## 2013-09-13 DIAGNOSIS — O09293 Supervision of pregnancy with other poor reproductive or obstetric history, third trimester: Secondary | ICD-10-CM

## 2013-09-13 LAB — US OB FOLLOW UP

## 2013-09-15 NOTE — Progress Notes (Signed)
NST reactive on 6/22 

## 2013-09-16 ENCOUNTER — Encounter: Payer: Self-pay | Admitting: Obstetrics and Gynecology

## 2013-09-16 ENCOUNTER — Ambulatory Visit (INDEPENDENT_AMBULATORY_CARE_PROVIDER_SITE_OTHER): Payer: Medicaid Other | Admitting: Obstetrics and Gynecology

## 2013-09-16 VITALS — BP 107/67 | HR 90 | Wt 160.4 lb

## 2013-09-16 DIAGNOSIS — Z2839 Other underimmunization status: Secondary | ICD-10-CM

## 2013-09-16 DIAGNOSIS — O09523 Supervision of elderly multigravida, third trimester: Secondary | ICD-10-CM

## 2013-09-16 DIAGNOSIS — O34219 Maternal care for unspecified type scar from previous cesarean delivery: Secondary | ICD-10-CM

## 2013-09-16 DIAGNOSIS — O09293 Supervision of pregnancy with other poor reproductive or obstetric history, third trimester: Secondary | ICD-10-CM

## 2013-09-16 DIAGNOSIS — Z283 Underimmunization status: Secondary | ICD-10-CM

## 2013-09-16 DIAGNOSIS — O9989 Other specified diseases and conditions complicating pregnancy, childbirth and the puerperium: Secondary | ICD-10-CM

## 2013-09-16 DIAGNOSIS — O09529 Supervision of elderly multigravida, unspecified trimester: Secondary | ICD-10-CM

## 2013-09-16 DIAGNOSIS — O09299 Supervision of pregnancy with other poor reproductive or obstetric history, unspecified trimester: Secondary | ICD-10-CM

## 2013-09-16 LAB — POCT URINALYSIS DIP (DEVICE)
BILIRUBIN URINE: NEGATIVE
GLUCOSE, UA: NEGATIVE mg/dL
Hgb urine dipstick: NEGATIVE
KETONES UR: NEGATIVE mg/dL
Nitrite: NEGATIVE
Protein, ur: NEGATIVE mg/dL
Specific Gravity, Urine: <=1.005 (ref 1.005–1.030)
Urobilinogen, UA: 0.2 mg/dL (ref 0.0–1.0)
pH: 6 (ref 5.0–8.0)

## 2013-09-16 NOTE — Progress Notes (Signed)
Patient is doing well without complaints. FM/labor precautions reviewed. Patient scheduled for repeat c-section on 7/20. NST reviewed and reactive

## 2013-09-16 NOTE — Progress Notes (Signed)
Discussed planned C/S with pt. She requests letter for her husband so that he can be off work without repercussion at time of her delivery and for a short time afterward. I advised her to have her husband obtain FMLA or disability papers and bring back to be completed. She voiced understanding. Interpreter present for entire visit.

## 2013-09-20 ENCOUNTER — Ambulatory Visit (INDEPENDENT_AMBULATORY_CARE_PROVIDER_SITE_OTHER): Payer: Medicaid Other | Admitting: *Deleted

## 2013-09-20 VITALS — BP 110/69 | HR 93

## 2013-09-20 DIAGNOSIS — O09299 Supervision of pregnancy with other poor reproductive or obstetric history, unspecified trimester: Secondary | ICD-10-CM

## 2013-09-20 DIAGNOSIS — O09293 Supervision of pregnancy with other poor reproductive or obstetric history, third trimester: Secondary | ICD-10-CM

## 2013-09-20 LAB — US OB FOLLOW UP

## 2013-09-20 NOTE — Progress Notes (Signed)
US for growth scheduled 7/13  @ 1545

## 2013-09-21 ENCOUNTER — Encounter (HOSPITAL_COMMUNITY): Payer: Self-pay | Admitting: Pharmacist

## 2013-09-21 NOTE — Progress Notes (Signed)
NST reviewed and reactive.  

## 2013-09-23 ENCOUNTER — Encounter: Payer: Self-pay | Admitting: Obstetrics and Gynecology

## 2013-09-23 ENCOUNTER — Ambulatory Visit (INDEPENDENT_AMBULATORY_CARE_PROVIDER_SITE_OTHER): Payer: Medicaid Other | Admitting: Obstetrics and Gynecology

## 2013-09-23 VITALS — BP 108/72 | HR 92 | Wt 160.4 lb

## 2013-09-23 DIAGNOSIS — O09529 Supervision of elderly multigravida, unspecified trimester: Secondary | ICD-10-CM

## 2013-09-23 DIAGNOSIS — Z283 Underimmunization status: Secondary | ICD-10-CM

## 2013-09-23 DIAGNOSIS — O34219 Maternal care for unspecified type scar from previous cesarean delivery: Secondary | ICD-10-CM

## 2013-09-23 DIAGNOSIS — Z2839 Other underimmunization status: Secondary | ICD-10-CM

## 2013-09-23 DIAGNOSIS — O09899 Supervision of other high risk pregnancies, unspecified trimester: Secondary | ICD-10-CM

## 2013-09-23 DIAGNOSIS — O09523 Supervision of elderly multigravida, third trimester: Secondary | ICD-10-CM

## 2013-09-23 DIAGNOSIS — O09293 Supervision of pregnancy with other poor reproductive or obstetric history, third trimester: Secondary | ICD-10-CM

## 2013-09-23 DIAGNOSIS — O09299 Supervision of pregnancy with other poor reproductive or obstetric history, unspecified trimester: Secondary | ICD-10-CM

## 2013-09-23 DIAGNOSIS — O9989 Other specified diseases and conditions complicating pregnancy, childbirth and the puerperium: Secondary | ICD-10-CM

## 2013-09-23 LAB — POCT URINALYSIS DIP (DEVICE)
BILIRUBIN URINE: NEGATIVE
BILIRUBIN URINE: NEGATIVE
GLUCOSE, UA: NEGATIVE mg/dL
GLUCOSE, UA: NEGATIVE mg/dL
HGB URINE DIPSTICK: NEGATIVE
Ketones, ur: NEGATIVE mg/dL
Ketones, ur: NEGATIVE mg/dL
Nitrite: NEGATIVE
Nitrite: NEGATIVE
Protein, ur: NEGATIVE mg/dL
Protein, ur: NEGATIVE mg/dL
Specific Gravity, Urine: <=1.005 (ref 1.005–1.030)
Urobilinogen, UA: 0.2 mg/dL (ref 0.0–1.0)
Urobilinogen, UA: 0.2 mg/dL (ref 0.0–1.0)
pH: 5.5 (ref 5.0–8.0)
pH: 6.5 (ref 5.0–8.0)

## 2013-09-23 NOTE — Progress Notes (Signed)
Patient is doing well without complaints. FM/labor precautions reviewed. Patient scheduled for US on 7/13 and repeat c-section already scheduled NST reviewed and reactive

## 2013-09-27 ENCOUNTER — Other Ambulatory Visit: Payer: Self-pay | Admitting: Family Medicine

## 2013-09-27 ENCOUNTER — Ambulatory Visit (INDEPENDENT_AMBULATORY_CARE_PROVIDER_SITE_OTHER): Payer: Medicaid Other | Admitting: *Deleted

## 2013-09-27 ENCOUNTER — Ambulatory Visit (HOSPITAL_COMMUNITY)
Admission: RE | Admit: 2013-09-27 | Discharge: 2013-09-27 | Disposition: A | Discharge status: Home / Self Care | Payer: Medicaid Other | Source: Ambulatory Visit | Attending: Family Medicine | Admitting: Family Medicine

## 2013-09-27 VITALS — BP 111/73 | HR 91

## 2013-09-27 DIAGNOSIS — Z3689 Encounter for other specified antenatal screening: Secondary | ICD-10-CM | POA: Diagnosis present

## 2013-09-27 DIAGNOSIS — O09299 Supervision of pregnancy with other poor reproductive or obstetric history, unspecified trimester: Secondary | ICD-10-CM | POA: Diagnosis not present

## 2013-09-27 DIAGNOSIS — O09293 Supervision of pregnancy with other poor reproductive or obstetric history, third trimester: Secondary | ICD-10-CM

## 2013-09-27 NOTE — Progress Notes (Signed)
US for growth done today.  C/S scheduled on 7/20.

## 2013-09-27 NOTE — Progress Notes (Signed)
NST reviewed and reactive.  

## 2013-09-30 ENCOUNTER — Ambulatory Visit (INDEPENDENT_AMBULATORY_CARE_PROVIDER_SITE_OTHER): Payer: Medicaid Other | Admitting: Obstetrics & Gynecology

## 2013-09-30 VITALS — BP 107/71 | HR 97 | Wt 161.8 lb

## 2013-09-30 DIAGNOSIS — O09299 Supervision of pregnancy with other poor reproductive or obstetric history, unspecified trimester: Secondary | ICD-10-CM

## 2013-09-30 NOTE — Progress Notes (Signed)
Rpt C/S scheduled 7/20 - pt did not have pre-op appt. Instructions given

## 2013-09-30 NOTE — Progress Notes (Signed)
NST performed today was reviewed and was found to be reactive.   RCS on 10/04/13; preoperative instructions reviewed with patient with help of interpreter.

## 2013-09-30 NOTE — Patient Instructions (Signed)
Return to clinic for any obstetric concerns or go to [facility] for evaluation  

## 2013-10-03 NOTE — Progress Notes (Signed)
NST 09/13/13 reactive 

## 2013-10-04 ENCOUNTER — Encounter (HOSPITAL_COMMUNITY): Payer: Self-pay | Admitting: Obstetrics & Gynecology

## 2013-10-04 ENCOUNTER — Ambulatory Visit (HOSPITAL_COMMUNITY)
Admission: RE | Admit: 2013-10-04 | Discharge: 2013-10-04 | Disposition: A | Discharge status: Home / Self Care | Payer: Medicaid Other | Source: Ambulatory Visit | Attending: Obstetrics & Gynecology | Admitting: Obstetrics & Gynecology

## 2013-10-04 DIAGNOSIS — O09529 Supervision of elderly multigravida, unspecified trimester: Secondary | ICD-10-CM | POA: Diagnosis present

## 2013-10-04 DIAGNOSIS — O34219 Maternal care for unspecified type scar from previous cesarean delivery: Secondary | ICD-10-CM

## 2013-10-04 DIAGNOSIS — Z538 Procedure and treatment not carried out for other reasons: Secondary | ICD-10-CM

## 2013-10-04 DIAGNOSIS — Z302 Encounter for sterilization: Secondary | ICD-10-CM | POA: Diagnosis not present

## 2013-10-04 DIAGNOSIS — O26899 Other specified pregnancy related conditions, unspecified trimester: Secondary | ICD-10-CM | POA: Diagnosis not present

## 2013-10-04 LAB — CBC
HCT: 36.3 % (ref 36.0–46.0)
Hemoglobin: 11.6 g/dL — ABNORMAL LOW (ref 12.0–15.0)
MCH: 24.4 pg — ABNORMAL LOW (ref 26.0–34.0)
MCHC: 32 g/dL (ref 30.0–36.0)
MCV: 76.3 fL — ABNORMAL LOW (ref 78.0–100.0)
Platelets: 211 10*3/uL (ref 150–400)
RBC: 4.76 MIL/uL (ref 3.87–5.11)
RDW: 14 % (ref 11.5–15.5)
WBC: 9.4 10*3/uL (ref 4.0–10.5)

## 2013-10-04 LAB — TYPE AND SCREEN
ABO/RH(D): AB POS
ANTIBODY SCREEN: NEGATIVE

## 2013-10-04 LAB — RPR

## 2013-10-04 MED ORDER — OXYTOCIN 10 UNIT/ML IJ SOLN
INTRAMUSCULAR | Status: AC
  Filled 2013-10-04: qty 4

## 2013-10-04 MED ORDER — LACTATED RINGERS IV SOLN
INTRAVENOUS | Status: DC
Start: 2013-10-04 — End: 2013-10-04
  Administered 2013-10-04 – 2013-10-05 (×3): via INTRAVENOUS

## 2013-10-04 MED ORDER — CEFAZOLIN SODIUM-DEXTROSE 2-3 GM-% IV SOLR
INTRAVENOUS | Status: AC
  Filled 2013-10-04: qty 50

## 2013-10-04 MED ORDER — FENTANYL CITRATE 0.05 MG/ML IJ SOLN
INTRAMUSCULAR | Status: AC
  Filled 2013-10-04: qty 2

## 2013-10-04 MED ORDER — ONDANSETRON HCL 4 MG/2ML IJ SOLN
INTRAMUSCULAR | Status: AC
  Filled 2013-10-04: qty 2

## 2013-10-04 MED ORDER — MORPHINE SULFATE 0.5 MG/ML IJ SOLN
INTRAMUSCULAR | Status: AC
  Filled 2013-10-04: qty 10

## 2013-10-04 MED ORDER — BUPIVACAINE HCL (PF) 0.5 % IJ SOLN
INTRAMUSCULAR | Status: AC
  Filled 2013-10-04: qty 30

## 2013-10-04 MED ORDER — EPHEDRINE 5 MG/ML INJ
INTRAVENOUS | Status: AC
  Filled 2013-10-04: qty 10

## 2013-10-04 MED ORDER — PHENYLEPHRINE 8 MG IN D5W 100 ML (0.08MG/ML) PREMIX OPTIME
INJECTION | INTRAVENOUS | Status: AC
  Filled 2013-10-04: qty 100

## 2013-10-04 MED ORDER — SCOPOLAMINE 1 MG/3DAYS TD PT72
1.0000 | MEDICATED_PATCH | Freq: Once | TRANSDERMAL | Status: DC
Start: 2013-10-04 — End: 2013-10-04

## 2013-10-04 NOTE — Progress Notes (Signed)
Surgery cancelled per Dr. Erin FullingHarraway-Smith because no interpreter was available.   Has been rescheduled for 7/21 with Dr. Macon LargeAnyanwu and we have been assured an interpreter will be here at 13:15 per [facility] [place] [facility].  [phone].  The patient has been instructed and verbalizes understanding.

## 2013-10-04 NOTE — H&P (Signed)
[name] is a 38 y.o. female presenting for repeat c-section with sterilization. History OB History   Grav Para Term Preterm Abortions TAB SAB Ect Mult Living   5 3 3  1  1   2      History reviewed. No pertinent past medical history. Past Surgical History  Procedure Laterality Date  . Dilation and evacuation N/A 07/18/2012    Procedure: DILATATION AND EVACUATION;  Surgeon: Kristina RankinsEvelyn Varnado, MD;  Location: [facility];  Service: Gynecology;  Laterality: N/A;  . Cesarean section     Family History: family history is not on file. Social History:  reports that she has never smoked. She has never used smokeless tobacco. She reports that she does not drink alcohol or use illicit drugs.   Prenatal Transfer Tool  Maternal Diabetes: No Genetic Screening: Declined Maternal Ultrasounds/Referrals: Normal Fetal Ultrasounds or other Referrals:  Referred to Materal Fetal Medicine  Maternal Substance Abuse:  No Significant Maternal Medications:  None Significant Maternal Lab Results:  None Other Comments:  None  ROS    Blood pressure 120/76, pulse 102, temperature 98.4 F (36.9 C), temperature source Oral, resp. rate 18, height 4' 10.5" (1.486 m), weight 161 lb (73.029 kg), last menstrual period 01/02/2013, SpO2 99.00%, unknown if currently breastfeeding. Exam Physical Exam  Pt in NAD Lungs: CTA CV: RRR Abd: soft, NT, ND. Well healed transverse incision Ext: no edema   Prenatal labs: ABO, Rh: AB/Positive/-- (03/13 0000) Antibody: Negative (03/13 0000) Rubella:  nonimmune RPR: NON REAC (04/28 1445)  HBsAg: NEGATIVE (05/14 0854)  HIV: NONREACTIVE (04/28 1445)  GBS:   neg  CBC    Component Value Date/Time   WBC 9.4 10/04/2013 0916   RBC 4.76 10/04/2013 0916   HGB 11.6* 10/04/2013 0916   HCT 36.3 10/04/2013 0916   PLT 211 10/04/2013 0916   MCV 76.3* 10/04/2013 0916   MCH 24.4* 10/04/2013 0916   MCHC 32.0 10/04/2013 0916   RDW 14.0 10/04/2013 0916   LYMPHSABS 1.8 07/18/2012 0939   MONOABS  0.4 07/18/2012 0939   EOSABS 0.8* 07/18/2012 0939   BASOSABS 0.0 07/18/2012 0939     Assessment/Plan: Unable to consent pt due to inability to get interpreter.  Interpreter scheduled for tomorrow.  Pt rescheduled for tomorrow at 1:30 pm.  Dr. Macon [name] aware.  Pt is nonimmune.  Needs rubella vac post delivery  [name], [name] 10/04/2013, 9:52 AM

## 2013-10-05 ENCOUNTER — Encounter (HOSPITAL_COMMUNITY): Payer: Medicaid Other | Admitting: Anesthesiology

## 2013-10-05 ENCOUNTER — Inpatient Hospital Stay (HOSPITAL_COMMUNITY): Payer: Medicaid Other | Admitting: Anesthesiology

## 2013-10-05 ENCOUNTER — Inpatient Hospital Stay (HOSPITAL_COMMUNITY)
Admission: RE | Admit: 2013-10-05 | Discharge: 2013-10-07 | DRG: 766 | Disposition: A | Discharge status: Home / Self Care | Payer: Medicaid Other | Source: Ambulatory Visit | Attending: Obstetrics & Gynecology | Admitting: Obstetrics & Gynecology

## 2013-10-05 ENCOUNTER — Encounter (HOSPITAL_COMMUNITY)
Admission: RE | Disposition: A | Discharge status: Home / Self Care | Payer: Self-pay | Source: Ambulatory Visit | Attending: Obstetrics & Gynecology

## 2013-10-05 ENCOUNTER — Encounter (HOSPITAL_COMMUNITY): Payer: Self-pay | Admitting: *Deleted

## 2013-10-05 DIAGNOSIS — O09529 Supervision of elderly multigravida, unspecified trimester: Secondary | ICD-10-CM | POA: Diagnosis present

## 2013-10-05 DIAGNOSIS — O26899 Other specified pregnancy related conditions, unspecified trimester: Secondary | ICD-10-CM

## 2013-10-05 DIAGNOSIS — Z302 Encounter for sterilization: Secondary | ICD-10-CM | POA: Diagnosis not present

## 2013-10-05 DIAGNOSIS — O34219 Maternal care for unspecified type scar from previous cesarean delivery: Secondary | ICD-10-CM | POA: Diagnosis present

## 2013-10-05 DIAGNOSIS — Z98891 History of uterine scar from previous surgery: Secondary | ICD-10-CM

## 2013-10-05 HISTORY — PX: LAPAROTOMY: SHX154

## 2013-10-05 SURGERY — Surgical Case
Anesthesia: Spinal | Site: Abdomen

## 2013-10-05 SURGERY — LAPAROTOMY, EXPLORATORY
Anesthesia: Spinal | Site: Abdomen

## 2013-10-05 MED ORDER — SIMETHICONE 80 MG PO CHEW
80.0000 mg | CHEWABLE_TABLET | Freq: Three times a day (TID) | ORAL | Status: DC
  Administered 2013-10-06 – 2013-10-07 (×3): 80 mg via ORAL
  Filled 2013-10-05 (×5): qty 1

## 2013-10-05 MED ORDER — MEPERIDINE HCL 25 MG/ML IJ SOLN: 6.2500 mg | INTRAMUSCULAR | Status: DC | PRN

## 2013-10-05 MED ORDER — PHENYLEPHRINE 40 MCG/ML (10ML) SYRINGE FOR IV PUSH (FOR BLOOD PRESSURE SUPPORT)
PREFILLED_SYRINGE | INTRAVENOUS | Status: AC
Start: 2013-10-05 — End: 2013-10-05
  Filled 2013-10-05: qty 5

## 2013-10-05 MED ORDER — ONDANSETRON HCL 4 MG PO TABS: 4.0000 mg | ORAL_TABLET | ORAL | Status: DC | PRN

## 2013-10-05 MED ORDER — IBUPROFEN 600 MG PO TABS
600.0000 mg | ORAL_TABLET | Freq: Four times a day (QID) | ORAL | Status: DC
  Administered 2013-10-06 – 2013-10-07 (×6): 600 mg via ORAL
  Filled 2013-10-05 (×7): qty 1

## 2013-10-05 MED ORDER — DIPHENHYDRAMINE HCL 50 MG/ML IJ SOLN: 12.5000 mg | INTRAMUSCULAR | Status: DC | PRN

## 2013-10-05 MED ORDER — SIMETHICONE 80 MG PO CHEW: 80.0000 mg | CHEWABLE_TABLET | ORAL | Status: DC | PRN

## 2013-10-05 MED ORDER — MIDAZOLAM HCL 2 MG/2ML IJ SOLN: 0.5000 mg | Freq: Once | INTRAMUSCULAR | Status: DC | PRN

## 2013-10-05 MED ORDER — PHENYLEPHRINE 8 MG IN D5W 100 ML (0.08MG/ML) PREMIX OPTIME
INJECTION | INTRAVENOUS | Status: DC | PRN
  Administered 2013-10-05: 80 ug/min via INTRAVENOUS

## 2013-10-05 MED ORDER — LACTATED RINGERS IV SOLN: INTRAVENOUS | Status: DC

## 2013-10-05 MED ORDER — SODIUM CHLORIDE 0.9 % IJ SOLN: 3.0000 mL | INTRAMUSCULAR | Status: DC | PRN

## 2013-10-05 MED ORDER — CEFAZOLIN SODIUM-DEXTROSE 2-3 GM-% IV SOLR
INTRAVENOUS | Status: AC
  Filled 2013-10-05: qty 50

## 2013-10-05 MED ORDER — SCOPOLAMINE 1 MG/3DAYS TD PT72: 1.0000 | MEDICATED_PATCH | Freq: Once | TRANSDERMAL | Status: DC

## 2013-10-05 MED ORDER — OXYTOCIN 10 UNIT/ML IJ SOLN
40.0000 [IU] | INTRAVENOUS | Status: DC | PRN
  Administered 2013-10-05: 40 [IU] via INTRAVENOUS

## 2013-10-05 MED ORDER — DIPHENHYDRAMINE HCL 25 MG PO CAPS
25.0000 mg | ORAL_CAPSULE | Freq: Four times a day (QID) | ORAL | Status: DC | PRN
  Filled 2013-10-05: qty 1

## 2013-10-05 MED ORDER — SUCCINYLCHOLINE CHLORIDE 20 MG/ML IJ SOLN
INTRAMUSCULAR | Status: AC
  Filled 2013-10-05: qty 10

## 2013-10-05 MED ORDER — ZOLPIDEM TARTRATE 5 MG PO TABS: 5.0000 mg | ORAL_TABLET | Freq: Every evening | ORAL | Status: DC | PRN

## 2013-10-05 MED ORDER — WITCH HAZEL-GLYCERIN EX PADS: 1.0000 "application " | MEDICATED_PAD | CUTANEOUS | Status: DC | PRN

## 2013-10-05 MED ORDER — FENTANYL CITRATE 0.05 MG/ML IJ SOLN
INTRAMUSCULAR | Status: AC
  Filled 2013-10-05: qty 2

## 2013-10-05 MED ORDER — SENNOSIDES-DOCUSATE SODIUM 8.6-50 MG PO TABS
2.0000 | ORAL_TABLET | ORAL | Status: DC
  Administered 2013-10-06 – 2013-10-07 (×2): 2 via ORAL
  Filled 2013-10-05 (×2): qty 2

## 2013-10-05 MED ORDER — BUPIVACAINE IN DEXTROSE 0.75-8.25 % IT SOLN
INTRATHECAL | Status: DC | PRN
  Administered 2013-10-05: 1.2 mL via INTRATHECAL

## 2013-10-05 MED ORDER — METOCLOPRAMIDE HCL 5 MG/ML IJ SOLN
INTRAMUSCULAR | Status: DC | PRN
  Administered 2013-10-05 (×2): 5 mg via INTRAVENOUS

## 2013-10-05 MED ORDER — PROMETHAZINE HCL 25 MG/ML IJ SOLN
6.2500 mg | INTRAMUSCULAR | Status: DC | PRN
  Administered 2013-10-05: 12.5 mg via INTRAVENOUS

## 2013-10-05 MED ORDER — MENTHOL 3 MG MT LOZG
1.0000 | LOZENGE | OROMUCOSAL | Status: DC | PRN
Start: 2013-10-05 — End: 2013-10-07

## 2013-10-05 MED ORDER — ROCURONIUM BROMIDE 100 MG/10ML IV SOLN
INTRAVENOUS | Status: AC
  Filled 2013-10-05: qty 1

## 2013-10-05 MED ORDER — LACTATED RINGERS IV SOLN
INTRAVENOUS | Status: DC
  Administered 2013-10-06: 01:00:00 via INTRAVENOUS

## 2013-10-05 MED ORDER — PROMETHAZINE HCL 25 MG/ML IJ SOLN: 6.2500 mg | INTRAMUSCULAR | Status: DC | PRN

## 2013-10-05 MED ORDER — ONDANSETRON HCL 4 MG/2ML IJ SOLN
INTRAMUSCULAR | Status: DC | PRN
  Administered 2013-10-05: 4 mg via INTRAVENOUS

## 2013-10-05 MED ORDER — GLYCOPYRROLATE 0.2 MG/ML IJ SOLN
INTRAMUSCULAR | Status: DC | PRN
  Administered 2013-10-05: 0.2 mg via INTRAVENOUS

## 2013-10-05 MED ORDER — TETANUS-DIPHTH-ACELL PERTUSSIS 5-2.5-18.5 LF-MCG/0.5 IM SUSP: 0.5000 mL | Freq: Once | INTRAMUSCULAR | Status: DC

## 2013-10-05 MED ORDER — LANOLIN HYDROUS EX OINT
1.0000 | TOPICAL_OINTMENT | CUTANEOUS | Status: DC | PRN
Start: 2013-10-05 — End: 2013-10-07

## 2013-10-05 MED ORDER — HYDROMORPHONE HCL PF 1 MG/ML IJ SOLN: 0.2500 mg | INTRAMUSCULAR | Status: DC | PRN

## 2013-10-05 MED ORDER — OXYTOCIN 10 UNIT/ML IJ SOLN
INTRAMUSCULAR | Status: AC
  Filled 2013-10-05: qty 4

## 2013-10-05 MED ORDER — PHENYLEPHRINE 40 MCG/ML (10ML) SYRINGE FOR IV PUSH (FOR BLOOD PRESSURE SUPPORT)
PREFILLED_SYRINGE | INTRAVENOUS | Status: AC
  Filled 2013-10-05: qty 5

## 2013-10-05 MED ORDER — ONDANSETRON HCL 4 MG/2ML IJ SOLN
INTRAMUSCULAR | Status: AC
  Filled 2013-10-05: qty 2

## 2013-10-05 MED ORDER — KETOROLAC TROMETHAMINE 30 MG/ML IJ SOLN: 30.0000 mg | Freq: Four times a day (QID) | INTRAMUSCULAR | Status: AC | PRN

## 2013-10-05 MED ORDER — LACTATED RINGERS IV SOLN
INTRAVENOUS | Status: DC | PRN
  Administered 2013-10-05: 20:00:00 via INTRAVENOUS

## 2013-10-05 MED ORDER — DIPHENHYDRAMINE HCL 50 MG/ML IJ SOLN
INTRAMUSCULAR | Status: AC
  Filled 2013-10-05: qty 1

## 2013-10-05 MED ORDER — NALBUPHINE HCL 10 MG/ML IJ SOLN
INTRAMUSCULAR | Status: AC
  Administered 2013-10-05: 10 mg via INTRAVENOUS
  Filled 2013-10-05: qty 1

## 2013-10-05 MED ORDER — OXYTOCIN 40 UNITS IN LACTATED RINGERS INFUSION - SIMPLE MED: 62.5000 mL/h | INTRAVENOUS | Status: AC

## 2013-10-05 MED ORDER — KETOROLAC TROMETHAMINE 30 MG/ML IJ SOLN: 15.0000 mg | Freq: Once | INTRAMUSCULAR | Status: AC | PRN

## 2013-10-05 MED ORDER — 0.9 % SODIUM CHLORIDE (POUR BTL) OPTIME
TOPICAL | Status: DC | PRN
  Administered 2013-10-05: 1000 mL

## 2013-10-05 MED ORDER — METOCLOPRAMIDE HCL 5 MG/ML IJ SOLN: 10.0000 mg | Freq: Three times a day (TID) | INTRAMUSCULAR | Status: DC | PRN

## 2013-10-05 MED ORDER — EPHEDRINE 5 MG/ML INJ
INTRAVENOUS | Status: AC
  Filled 2013-10-05: qty 10

## 2013-10-05 MED ORDER — NALOXONE HCL 0.4 MG/ML IJ SOLN: 0.4000 mg | INTRAMUSCULAR | Status: DC | PRN

## 2013-10-05 MED ORDER — MIDAZOLAM HCL 2 MG/2ML IJ SOLN
INTRAMUSCULAR | Status: AC
  Filled 2013-10-05: qty 2

## 2013-10-05 MED ORDER — ONDANSETRON HCL 4 MG/2ML IJ SOLN: INTRAMUSCULAR | Status: DC | PRN

## 2013-10-05 MED ORDER — SCOPOLAMINE 1 MG/3DAYS TD PT72
MEDICATED_PATCH | TRANSDERMAL | Status: AC
  Administered 2013-10-05: 1.5 mg via TRANSDERMAL
  Filled 2013-10-05: qty 1

## 2013-10-05 MED ORDER — NALBUPHINE HCL 10 MG/ML IJ SOLN
5.0000 mg | INTRAMUSCULAR | Status: DC | PRN
Start: 2013-10-05 — End: 2013-10-07
  Administered 2013-10-05: 10 mg via INTRAVENOUS

## 2013-10-05 MED ORDER — DEXAMETHASONE SODIUM PHOSPHATE 10 MG/ML IJ SOLN
INTRAMUSCULAR | Status: AC
  Filled 2013-10-05: qty 1

## 2013-10-05 MED ORDER — SCOPOLAMINE 1 MG/3DAYS TD PT72
1.0000 | MEDICATED_PATCH | TRANSDERMAL | Status: DC
  Administered 2013-10-05: 1.5 mg via TRANSDERMAL

## 2013-10-05 MED ORDER — LACTATED RINGERS IV SOLN
INTRAVENOUS | Status: DC | PRN
  Administered 2013-10-05: 16:00:00 via INTRAVENOUS

## 2013-10-05 MED ORDER — FENTANYL CITRATE 0.05 MG/ML IJ SOLN
INTRAMUSCULAR | Status: DC | PRN
  Administered 2013-10-05: 25 ug via INTRATHECAL

## 2013-10-05 MED ORDER — FENTANYL CITRATE 0.05 MG/ML IJ SOLN
INTRAMUSCULAR | Status: AC
Start: 2013-10-05 — End: 2013-10-05
  Filled 2013-10-05: qty 2

## 2013-10-05 MED ORDER — CEFAZOLIN SODIUM-DEXTROSE 2-3 GM-% IV SOLR
2.0000 g | INTRAVENOUS | Status: AC
  Administered 2013-10-05: 2 g via INTRAVENOUS

## 2013-10-05 MED ORDER — IBUPROFEN 600 MG PO TABS: 600.0000 mg | ORAL_TABLET | Freq: Four times a day (QID) | ORAL | Status: DC | PRN

## 2013-10-05 MED ORDER — PHENYLEPHRINE HCL 10 MG/ML IJ SOLN
INTRAMUSCULAR | Status: DC | PRN
  Administered 2013-10-05: 80 ug via INTRAVENOUS
  Administered 2013-10-05: 40 ug via INTRAVENOUS

## 2013-10-05 MED ORDER — MORPHINE SULFATE (PF) 0.5 MG/ML IJ SOLN
INTRAMUSCULAR | Status: DC | PRN
  Administered 2013-10-05: .15 mg via INTRATHECAL

## 2013-10-05 MED ORDER — LACTATED RINGERS IV SOLN
Freq: Once | INTRAVENOUS | Status: AC
  Administered 2013-10-05: 14:00:00 via INTRAVENOUS

## 2013-10-05 MED ORDER — BUPIVACAINE HCL (PF) 0.5 % IJ SOLN
INTRAMUSCULAR | Status: AC
  Filled 2013-10-05: qty 30

## 2013-10-05 MED ORDER — OXYCODONE-ACETAMINOPHEN 5-325 MG PO TABS
1.0000 | ORAL_TABLET | ORAL | Status: DC | PRN
  Administered 2013-10-07: 1 via ORAL
  Filled 2013-10-05: qty 1

## 2013-10-05 MED ORDER — ONDANSETRON HCL 4 MG/2ML IJ SOLN: 4.0000 mg | INTRAMUSCULAR | Status: DC | PRN

## 2013-10-05 MED ORDER — GLYCOPYRROLATE 0.2 MG/ML IJ SOLN
INTRAMUSCULAR | Status: AC
  Filled 2013-10-05: qty 1

## 2013-10-05 MED ORDER — PROMETHAZINE HCL 25 MG/ML IJ SOLN
INTRAMUSCULAR | Status: AC
  Filled 2013-10-05: qty 1

## 2013-10-05 MED ORDER — DIPHENHYDRAMINE HCL 25 MG PO CAPS
25.0000 mg | ORAL_CAPSULE | ORAL | Status: DC | PRN
  Administered 2013-10-06: 25 mg via ORAL
  Filled 2013-10-05 (×2): qty 1

## 2013-10-05 MED ORDER — ACETAMINOPHEN 500 MG PO TABS
1000.0000 mg | ORAL_TABLET | Freq: Four times a day (QID) | ORAL | Status: AC
  Administered 2013-10-06 (×3): 1000 mg via ORAL
  Filled 2013-10-05 (×4): qty 2

## 2013-10-05 MED ORDER — DIPHENHYDRAMINE HCL 50 MG/ML IJ SOLN: 25.0000 mg | INTRAMUSCULAR | Status: DC | PRN

## 2013-10-05 MED ORDER — ONDANSETRON HCL 4 MG/2ML IJ SOLN: 4.0000 mg | Freq: Three times a day (TID) | INTRAMUSCULAR | Status: DC | PRN

## 2013-10-05 MED ORDER — PROPOFOL 10 MG/ML IV EMUL
INTRAVENOUS | Status: AC
  Filled 2013-10-05: qty 20

## 2013-10-05 MED ORDER — PHENYLEPHRINE 8 MG IN D5W 100 ML (0.08MG/ML) PREMIX OPTIME
INJECTION | INTRAVENOUS | Status: AC
  Filled 2013-10-05: qty 100

## 2013-10-05 MED ORDER — DEXAMETHASONE SODIUM PHOSPHATE 10 MG/ML IJ SOLN
INTRAMUSCULAR | Status: DC | PRN
  Administered 2013-10-05: 10 mg via INTRAVENOUS

## 2013-10-05 MED ORDER — PRENATAL MULTIVITAMIN CH
1.0000 | ORAL_TABLET | Freq: Every day | ORAL | Status: DC
  Administered 2013-10-06: 1 via ORAL
  Filled 2013-10-05 (×2): qty 1

## 2013-10-05 MED ORDER — SIMETHICONE 80 MG PO CHEW
80.0000 mg | CHEWABLE_TABLET | ORAL | Status: DC
  Administered 2013-10-06 – 2013-10-07 (×2): 80 mg via ORAL
  Filled 2013-10-05 (×2): qty 1

## 2013-10-05 MED ORDER — NALOXONE HCL 1 MG/ML IJ SOLN
1.0000 ug/kg/h | INTRAMUSCULAR | Status: DC | PRN
  Filled 2013-10-05: qty 2

## 2013-10-05 MED ORDER — FENTANYL CITRATE 0.05 MG/ML IJ SOLN
INTRAMUSCULAR | Status: DC | PRN
  Administered 2013-10-05: 12.5 ug via INTRATHECAL

## 2013-10-05 MED ORDER — PHENYLEPHRINE HCL 10 MG/ML IJ SOLN
INTRAMUSCULAR | Status: DC | PRN
  Administered 2013-10-05: 40 ug via INTRAVENOUS
  Administered 2013-10-05 (×2): 80 ug via INTRAVENOUS

## 2013-10-05 MED ORDER — MORPHINE SULFATE 0.5 MG/ML IJ SOLN
INTRAMUSCULAR | Status: AC
  Filled 2013-10-05: qty 10

## 2013-10-05 MED ORDER — LACTATED RINGERS IV SOLN
INTRAVENOUS | Status: DC
  Administered 2013-10-05: 20:00:00 via INTRAVENOUS

## 2013-10-05 MED ORDER — DIBUCAINE 1 % RE OINT: 1.0000 "application " | TOPICAL_OINTMENT | RECTAL | Status: DC | PRN

## 2013-10-05 MED ORDER — FENTANYL CITRATE 0.05 MG/ML IJ SOLN: 25.0000 ug | INTRAMUSCULAR | Status: DC | PRN

## 2013-10-05 MED ORDER — OXYTOCIN 40 UNITS IN LACTATED RINGERS INFUSION - SIMPLE MED
INTRAVENOUS | Status: DC | PRN
  Administered 2013-10-05: 40 [IU] via INTRAVENOUS

## 2013-10-05 MED ORDER — CEFAZOLIN SODIUM-DEXTROSE 2-3 GM-% IV SOLR
2.0000 g | Freq: Once | INTRAVENOUS | Status: AC
  Administered 2013-10-05: 2 g via INTRAVENOUS

## 2013-10-05 MED ORDER — METOCLOPRAMIDE HCL 5 MG/ML IJ SOLN
INTRAMUSCULAR | Status: AC
  Filled 2013-10-05: qty 2

## 2013-10-05 MED ORDER — NALBUPHINE HCL 10 MG/ML IJ SOLN: 5.0000 mg | INTRAMUSCULAR | Status: DC | PRN

## 2013-10-05 SURGICAL SUPPLY — 36 items
CANISTER SUCT 3000ML (MISCELLANEOUS) ×3 IMPLANT
CLOSURE WOUND 1/2 X4 (GAUZE/BANDAGES/DRESSINGS)
CLOTH BEACON ORANGE TIMEOUT ST (SAFETY) ×3 IMPLANT
CONT PATH 16OZ SNAP LID 3702 (MISCELLANEOUS) ×3 IMPLANT
DECANTER SPIKE VIAL GLASS SM (MISCELLANEOUS) IMPLANT
DRAPE WARM FLUID 44X44 (DRAPE) IMPLANT
DRSG OPSITE POSTOP 4X10 (GAUZE/BANDAGES/DRESSINGS) ×2 IMPLANT
DURAPREP 26ML APPLICATOR (WOUND CARE) ×4 IMPLANT
GAUZE SPONGE 4X4 16PLY XRAY LF (GAUZE/BANDAGES/DRESSINGS) ×1 IMPLANT
GLOVE BIO SURGEON STRL SZ 6.5 (GLOVE) ×4 IMPLANT
GLOVE BIO SURGEONS STRL SZ 6.5 (GLOVE) ×2
GOWN STRL REUS W/TWL LRG LVL3 (GOWN DISPOSABLE) ×9 IMPLANT
HEMOSTAT SURGICEL 4X8 (HEMOSTASIS) ×2 IMPLANT
NDL SPNL 18GX3.5 QUINCKE PK (NEEDLE) ×1 IMPLANT
NEEDLE SPNL 18GX3.5 QUINCKE PK (NEEDLE) IMPLANT
NS IRRIG 1000ML POUR BTL (IV SOLUTION) ×3 IMPLANT
PACK ABDOMINAL GYN (CUSTOM PROCEDURE TRAY) ×3 IMPLANT
PAD ABD 7.5X8 STRL (GAUZE/BANDAGES/DRESSINGS) ×2 IMPLANT
PAD OB MATERNITY 4.3X12.25 (PERSONAL CARE ITEMS) ×3 IMPLANT
SPONGE GAUZE 4X4 12PLY (GAUZE/BANDAGES/DRESSINGS) ×2 IMPLANT
SPONGE LAP 18X18 X RAY DECT (DISPOSABLE) ×2 IMPLANT
STAPLER VISISTAT 35W (STAPLE) ×2 IMPLANT
STRIP CLOSURE SKIN 1/2X4 (GAUZE/BANDAGES/DRESSINGS) ×2 IMPLANT
SUT PDS AB 0 CTX 60 (SUTURE) ×2 IMPLANT
SUT VIC AB 0 CT1 27 (SUTURE) ×6
SUT VIC AB 0 CT1 27XBRD ANBCTR (SUTURE) ×2 IMPLANT
SUT VIC AB 2-0 CTX 36 (SUTURE) ×2 IMPLANT
SUT VIC AB 3-0 CT1 27 (SUTURE)
SUT VIC AB 3-0 CT1 TAPERPNT 27 (SUTURE) ×1 IMPLANT
SUT VIC AB 3-0 SH 27 (SUTURE)
SUT VIC AB 3-0 SH 27X BRD (SUTURE) IMPLANT
SUT VICRYL 0 TIES 12 18 (SUTURE) IMPLANT
SYR 30ML LL (SYRINGE) ×1 IMPLANT
TOWEL OR 17X24 6PK STRL BLUE (TOWEL DISPOSABLE) ×6 IMPLANT
TRAY FOLEY CATH 14FR (SET/KITS/TRAYS/PACK) ×1 IMPLANT
WATER STERILE IRR 1000ML POUR (IV SOLUTION) ×1 IMPLANT

## 2013-10-05 SURGICAL SUPPLY — 43 items
BINDER ABD UNIV 10 28-50 (GAUZE/BANDAGES/DRESSINGS) IMPLANT
BINDER ABD UNIV 12 45-62 (WOUND CARE) IMPLANT
BINDER ABDOM UNIV 10 (GAUZE/BANDAGES/DRESSINGS)
BINDER ABDOMINAL 46IN 62IN (WOUND CARE)
CLAMP CORD UMBIL (MISCELLANEOUS) IMPLANT
CLIP FILSHIE TUBAL LIGA STRL (Clip) ×2 IMPLANT
CLOTH BEACON ORANGE TIMEOUT ST (SAFETY) ×3 IMPLANT
DRAPE LG THREE QUARTER DISP (DRAPES) IMPLANT
DRSG OPSITE POSTOP 4X10 (GAUZE/BANDAGES/DRESSINGS) ×3 IMPLANT
DURAPREP 26ML APPLICATOR (WOUND CARE) ×3 IMPLANT
ELECT REM PT RETURN 9FT ADLT (ELECTROSURGICAL) ×3
ELECTRODE REM PT RTRN 9FT ADLT (ELECTROSURGICAL) ×1 IMPLANT
EXTRACTOR VACUUM KIWI (MISCELLANEOUS) IMPLANT
GLOVE BIO SURGEON STRL SZ7 (GLOVE) ×3 IMPLANT
GLOVE BIOGEL PI IND STRL 7.0 (GLOVE) ×1 IMPLANT
GLOVE BIOGEL PI INDICATOR 7.0 (GLOVE) ×2
GOWN STRL NON-REIN LRG LVL3 (GOWN DISPOSABLE) ×3 IMPLANT
GOWN STRL REUS W/TWL LRG LVL3 (GOWN DISPOSABLE) ×6 IMPLANT
KIT ABG SYR 3ML LUER SLIP (SYRINGE) IMPLANT
NDL HYPO 25X5/8 SAFETYGLIDE (NEEDLE) IMPLANT
NEEDLE HYPO 22GX1.5 SAFETY (NEEDLE) ×3 IMPLANT
NEEDLE HYPO 25X5/8 SAFETYGLIDE (NEEDLE) IMPLANT
NS IRRIG 1000ML POUR BTL (IV SOLUTION) ×3 IMPLANT
PACK C SECTION WH (CUSTOM PROCEDURE TRAY) ×3 IMPLANT
PAD ABD 8X7 1/2 STERILE (GAUZE/BANDAGES/DRESSINGS) ×2 IMPLANT
PAD OB MATERNITY 4.3X12.25 (PERSONAL CARE ITEMS) ×3 IMPLANT
RTRCTR C-SECT PINK 25CM LRG (MISCELLANEOUS) IMPLANT
SPONGE GAUZE 4X4 12PLY STER LF (GAUZE/BANDAGES/DRESSINGS) ×2 IMPLANT
SPONGE SURGIFOAM ABS GEL 12-7 (HEMOSTASIS) IMPLANT
STAPLER VISISTAT 35W (STAPLE) IMPLANT
SUT MNCRL AB 3-0 PS2 27 (SUTURE) ×2 IMPLANT
SUT PDS AB 0 CTX 60 (SUTURE) ×2 IMPLANT
SUT PLAIN 0 NONE (SUTURE) ×2 IMPLANT
SUT SILK 0 TIES 10X30 (SUTURE) IMPLANT
SUT VIC AB 0 CT1 36 (SUTURE) ×9 IMPLANT
SUT VIC AB 3-0 CT1 27 (SUTURE) ×3
SUT VIC AB 3-0 CT1 TAPERPNT 27 (SUTURE) ×1 IMPLANT
SUT VIC AB 4-0 KS 27 (SUTURE) IMPLANT
SYR CONTROL 10ML LL (SYRINGE) ×3 IMPLANT
TAPE CLOTH SURG 4X10 WHT LF (GAUZE/BANDAGES/DRESSINGS) ×2 IMPLANT
TOWEL OR 17X24 6PK STRL BLUE (TOWEL DISPOSABLE) ×3 IMPLANT
TRAY FOLEY CATH 14FR (SET/KITS/TRAYS/PACK) ×3 IMPLANT
WATER STERILE IRR 1000ML POUR (IV SOLUTION) ×3 IMPLANT

## 2013-10-05 NOTE — Anesthesia Procedure Notes (Signed)
Spinal  Patient location during procedure: OR Start time: 10/05/2013 7:45 PM End time: 10/05/2013 7:49 PM Staffing Anesthesiologist: Leilani AbleHATCHETT, Antonietta Lansdowne Performed by: anesthesiologist  Preanesthetic Checklist Completed: patient identified, surgical consent, pre-op evaluation, timeout performed, IV checked, risks and benefits discussed and monitors and equipment checked Spinal Block Patient position: sitting Prep: site prepped and draped and DuraPrep Patient monitoring: heart rate, cardiac monitor, continuous pulse ox and blood pressure Approach: midline Location: L2-3 Injection technique: single-shot Needle Needle type: Pencan  Needle gauge: 24 G Needle length: 9 cm Needle insertion depth: 5 cm Assessment Sensory level: T8

## 2013-10-05 NOTE — Anesthesia Preprocedure Evaluation (Signed)
Anesthesia Evaluation  Patient identified by MRN, date of birth, ID band Patient awake    Reviewed: Allergy & Precautions, H&P , NPO status , Patient's Chart, lab work & pertinent test results  Airway Mallampati: II      Dental   Pulmonary  breath sounds clear to auscultation        Cardiovascular Exercise Tolerance: Good Rhythm:regular Rate:Normal     Neuro/Psych    GI/Hepatic   Endo/Other    Renal/GU      Musculoskeletal   Abdominal   Peds  Hematology   Anesthesia Other Findings   Reproductive/Obstetrics (+) Pregnancy                           Anesthesia Physical  Anesthesia Plan  ASA: II  Anesthesia Plan: Spinal   Post-op Pain Management:    Induction:   Airway Management Planned:   Additional Equipment:   Intra-op Plan:   Post-operative Plan:   Informed Consent: I have reviewed the patients History and Physical, chart, labs and discussed the procedure including the risks, benefits and alternatives for the proposed anesthesia with the patient or authorized representative who has indicated his/her understanding and acceptance.     Plan Discussed with: Anesthesiologist, CRNA and Surgeon  Anesthesia Plan Comments: (Bring back for bleeding. Hemodynamically stable.)        Anesthesia Quick Evaluation

## 2013-10-05 NOTE — Anesthesia Procedure Notes (Signed)
Spinal  Patient location during procedure: OR Start time: 10/05/2013 3:13 PM Staffing Anesthesiologist: Brayton CavesJACKSON, Camora Tremain Performed by: anesthesiologist  Preanesthetic Checklist Completed: patient identified, site marked, surgical consent, pre-op evaluation, timeout performed, IV checked, risks and benefits discussed and monitors and equipment checked Spinal Block Patient position: sitting Prep: DuraPrep Patient monitoring: heart rate, cardiac monitor, continuous pulse ox and blood pressure Approach: midline Location: L3-4 Injection technique: single-shot Needle Needle type: Sprotte  Needle gauge: 24 G Needle length: 9 cm Assessment Sensory level: T4 Additional Notes Patient identified.  Risk benefits discussed including failed block, incomplete pain control, headache, nerve damage, paralysis, blood pressure changes, nausea, vomiting, reactions to medication both toxic or allergic, and postpartum back pain.  Patient expressed understanding and wished to proceed.  All questions were answered.  Sterile technique used throughout procedure.  CSF was clear.  No parasthesia or other complications.  Please see nursing notes for vital signs.

## 2013-10-05 NOTE — Anesthesia Postprocedure Evaluation (Signed)
  Anesthesia Post Note  Patient: [name]  Procedure(s) Performed: Procedure(s) (LRB): REPEAT CESAREAN SECTION WITH BILATERAL TUBAL LIGATION (N/A)  Anesthesia type: Spinal  Patient location: PACU  Post pain: Pain level controlled  Post assessment: Post-op Vital signs reviewed  Last Vitals:  Filed Vitals:   10/05/13 1346  BP: 114/73  Pulse: 89  Temp: 36.9 C  Resp: 18    Post vital signs: Reviewed  Level of consciousness: awake  Complications: No apparent anesthesia complications

## 2013-10-05 NOTE — Transfer of Care (Signed)
Immediate Anesthesia Transfer of Care Note  Patient: [name]  Procedure(s) Performed: Procedure(s): REPEAT CESAREAN SECTION WITH BILATERAL TUBAL LIGATION (N/A)  Patient Location: PACU  Anesthesia Type:Spinal  Level of Consciousness: awake, alert  and oriented  Airway & Oxygen Therapy: Patient Spontanous Breathing  Post-op Assessment: Report given to PACU RN and Post -op Vital signs reviewed and stable  Post vital signs: Reviewed and stable  Complications: persistent nausea and vomiting

## 2013-10-05 NOTE — Anesthesia Postprocedure Evaluation (Signed)
Anesthesia Post Note  Patient: [name]  Procedure(s) Performed: Procedure(s) (LRB): EXPLORATORY LAPAROTOMY (N/A)  Anesthesia type: Spinal  Patient location: PACU  Post pain: Pain level controlled  Post assessment: Post-op Vital signs reviewed  Last Vitals:  Filed Vitals:   10/05/13 2030  BP:   Pulse:   Temp: 36.6 C  Resp:     Post vital signs: Reviewed  Level of consciousness: awake  Complications: No apparent anesthesia complications

## 2013-10-05 NOTE — Op Note (Signed)
The risks, benefits, and alternatives of surgery were explained, understood, accepted. Consents were signed. All questions were answered. In the operating room spinal anesthesia was applied without complication. Her abdomen and vagina were prepped and draped in the usual sterile fashion. A Foley catheter had already been placed, draining clear urine throughout case. Timeout procedure was done. After adequate anesthesia was assured the incision was opened. There was a moderate amount of blood oozing upwards from beneath the fascia. The fascial sutures were cut also. Retractors were placed and the hysterotomy incision was gradually oozing. About 200 cc of blood was removed from the pelvis. The hysterotomy was further closed with several longitudinal figure of eight sutures. Excellent hemostasis was noted. Surgicell was placed over the incision. The oviducts were inspected and noted to be hemostatic as were the rectus muscles.  The fascia was closed with a #1 PDS loop in a running nonlocking fashion. No defects were palpable. The subcutaneous tissue was irrigated, clean, and dried. The skin was closed with staples. Excellent cosmetic results were obtained. She was taken to the recovery room in stable condition. She tolerated the procedure well.

## 2013-10-05 NOTE — Op Note (Signed)
H Telly Barbian PROCEDURE DATE: 10/05/2013  PREOPERATIVE DIAGNOSES: Intrauterine pregnancy at [redacted]w[redacted]d weeks gestation; previous c-section x 2; undesired fertility  POSTOPERATIVE DIAGNOSES: The same  PROCEDURE:Repeat Low Transverse Cesarean Section, Bilateral Tubal Sterilization using Filshie clips  SURGEON:  Dr. Jaynie Collins  ASSISTANT:  Fredirick Lathe, MD  INDICATIONS: [name] is a 38 y.o. [phone] at [redacted]w[redacted]d here for cesarean section and bilateral tubal sterilization secondary to the indications listed under preoperative diagnoses; please see preoperative note for further details.  The risks of surgery were discussed with the patient including but were not limited to: bleeding which may require transfusion or reoperation; infection which may require antibiotics; injury to bowel, bladder, ureters or other surrounding organs; injury to the fetus; need for additional procedures including hysterectomy in the event of a life-threatening hemorrhage; placental abnormalities wth subsequent pregnancies, incisional problems, thromboembolic phenomenon and other postoperative/anesthesia complications.  Patient also desires permanent sterilization.  Other reversible forms of contraception were discussed with patient; she declines all other modalities. Risks of procedure discussed with patient including but not limited to: risk of regret, permanence of method, bleeding, infection, injury to surrounding organs and need for additional procedures.  Failure risk of 1-2% with increased risk of ectopic gestation if pregnancy occurs was also discussed with patient.  The patient concurred with the proposed plan, giving informed written consent for the procedures.    FINDINGS:  Viable female infant in cephalic presentation.  Apgars 8 and 9.  Clear amniotic fluid.  Intact placenta, three vessel cord.  Normal uterus, fallopian tubes and ovaries bilaterally. Fallopian tubes sterilized with Filshie clips bilaterally. She had minimal  intraperitoneal adhesive disease.  ANESTHESIA: Spinal INTRAVENOUS FLUIDS: 3300 ml ESTIMATED BLOOD LOSS: 600 ml URINE OUTPUT:  300 ml SPECIMENS: Placenta sent to L&D COMPLICATIONS: None immediate  PROCEDURE IN DETAIL:  The patient preoperatively received intravenous antibiotics and had sequential compression devices applied to her lower extremities.   She was then taken to the operating room where spinal anesthesia was administered and was found to be adequate. She was then placed in a dorsal supine position with a leftward tilt, and prepped and draped in a sterile manner.  A foley catheter was placed into her bladder and attached to constant gravity.  After an adequate timeout was performed, a Pfannenstiel skin incision was made over pre-existing scar with scalpel and carried through to the underlying layer of fascia. The fascia was incised in the midline, and this incision was extended bilaterally using the Mayo scissors.  Kocher clamps were applied to the superior aspect of the fascial incision and the underlying rectus muscles were dissected off sharply. A similar process was carried out on the inferior aspect of the fascial incision. The rectus muscles were separated in the midline sharply and the peritoneum was entered sharply.  Given scar tissue noted with her muscles and to create adequate exposure, the rectus muscles were transected horizontally about 2 cm on each side with care given to avoid the epigastric vessels.  Attention was turned to the lower uterine segment where a low transverse hysterotomy was made with a scalpel and extended bilaterally bluntly.  The infant was successfully delivered, the cord was clamped and cut and the infant was handed over to awaiting neonatology team. Uterine massage was then administered, and the placenta delivered intact with a three-vessel cord. The uterus was then cleared of clot and debris.  The hysterotomy was closed with 0 Vicryl in a running locked fashion,  and an imbricating layer was  also placed with 0 Vicryl.  Attention was then turned to the fallopian tubes, and Filshie clips were placed about 3 cm from the cornua, with care given to incorporate the underlying mesosalpinx on both sides, allowing for bilateral tubal sterilization. The pelvis was cleared of all clot and debris. Hemostasis was confirmed on all surfaces.   The fascia was then closed using 0 PDS in a running fashion.  The subcutaneous layer was irrigated, and 30 ml of 0.5% Marcaine was injected subcutaneously around the incision.  The skin was closed with a 4-0 Vicryl subcuticular stitch. The patient tolerated the procedure well. Sponge, lap, instrument and needle counts were correct x 2.  She was taken to the recovery room in stable condition.   Perry MountACOSTA,KRISTY ROCIO, MD  Attestation of Attending Supervision of Obstetric Fellow During Surgery: Surgery was performed by the Obstetric Fellow under my supervision and collaboration.  I have reviewed the Obstetric Fellow's operative report, and I agree with the documentation.  I was present and scrubbed for the entire procedure.    [name]  Wafaa Deemer, MD, FACOG Attending Obstetrician & Gynecologist Faculty Practice, [facility] [place]

## 2013-10-05 NOTE — Anesthesia Preprocedure Evaluation (Signed)
Anesthesia Evaluation  Patient identified by MRN, date of birth, ID band Patient awake    Reviewed: Allergy & Precautions, H&P , NPO status , Patient's Chart, lab work & pertinent test results  Airway Mallampati: II       Dental   Pulmonary  breath sounds clear to auscultation        Cardiovascular Exercise Tolerance: Good Rhythm:regular Rate:Normal     Neuro/Psych    GI/Hepatic   Endo/Other    Renal/GU      Musculoskeletal   Abdominal   Peds  Hematology   Anesthesia Other Findings   Reproductive/Obstetrics (+) Pregnancy                             Anesthesia Physical Anesthesia Plan  ASA: II  Anesthesia Plan: Spinal   Post-op Pain Management:    Induction:   Airway Management Planned:   Additional Equipment:   Intra-op Plan:   Post-operative Plan:   Informed Consent: I have reviewed the patients History and Physical, chart, labs and discussed the procedure including the risks, benefits and alternatives for the proposed anesthesia with the patient or authorized representative who has indicated his/her understanding and acceptance.     Plan Discussed with: Anesthesiologist, CRNA and Surgeon  Anesthesia Plan Comments:         Anesthesia Quick Evaluation  

## 2013-10-05 NOTE — Interval H&P Note (Signed)
History and Physical Interval Note   10/05/2013 1:48 PM  H Anselm PancoastDon [name]  has presented today for surgery, with the diagnosis of previous cesarean section and undesired fertility.   She speaks Montagnard mainly, interpreter present today. The risks of cesarean section were discussed with the patient including but were not limited to: bleeding which may require transfusion or reoperation; infection which may require antibiotics; injury to bowel, bladder, ureters or other surrounding organs; injury to the fetus; need for additional procedures including hysterectomy in the event of a life-threatening hemorrhage; placental abnormalities wth subsequent pregnancies, incisional problems, thromboembolic phenomenon and other postoperative/anesthesia complications.  Patient also desires permanent sterilization.  Other reversible forms of contraception were discussed with patient; she declines all other modalities. Risks of procedure discussed with patient including but not limited to: risk of regret, permanence of method, bleeding, infection, injury to surrounding organs and need for additional procedures.  Failure risk of 1-2% with increased risk of ectopic gestation if pregnancy occurs was also discussed with patient.  The patient concurred with the proposed plan, giving informed written consent for the procedures.  Patient has been NPO since last night she will remain NPO for procedure. Anesthesia and OR aware.  Preoperative prophylactic antibiotics and SCDs ordered on call to the OR.  To OR when ready.   [name]  Kristina Dehaas, MD, FACOG Attending Obstetrician & Gynecologist Faculty Practice, [facility] [place]

## 2013-10-05 NOTE — H&P (View-Only) (Signed)
H Kammie Maldonado is a 38 y.o. female presenting for repeat c-section with sterilization. History OB History   Grav Para Term Preterm Abortions TAB SAB Ect Mult Living   5 3 3  1  1   2     History reviewed. No pertinent past medical history. Past Surgical History  Procedure Laterality Date  . Dilation and evacuation N/A 07/18/2012    Procedure: DILATATION AND EVACUATION;  Surgeon: Evelyn Varnado, MD;  Location: [facility];  Service: Gynecology;  Laterality: N/A;  . Cesarean section     Family History: family history is not on file. Social History:  reports that she has never smoked. She has never used smokeless tobacco. She reports that she does not drink alcohol or use illicit drugs.   Prenatal Transfer Tool  Maternal Diabetes: No Genetic Screening: Declined Maternal Ultrasounds/Referrals: Normal Fetal Ultrasounds or other Referrals:  Referred to Materal Fetal Medicine  Maternal Substance Abuse:  No Significant Maternal Medications:  None Significant Maternal Lab Results:  None Other Comments:  None  ROS    Blood pressure 120/76, pulse 102, temperature 98.4 F (36.9 C), temperature source Oral, resp. rate 18, height 4' 10.5" (1.486 m), weight 161 lb (73.029 kg), last menstrual period 01/02/2013, SpO2 99.00%, unknown if currently breastfeeding. Exam Physical Exam  Pt in NAD Lungs: CTA CV: RRR Abd: soft, NT, ND. Well healed transverse incision Ext: no edema   Prenatal labs: ABO, Rh: AB/Positive/-- (03/13 0000) Antibody: Negative (03/13 0000) Rubella:  nonimmune RPR: NON REAC (04/28 1445)  HBsAg: NEGATIVE (05/14 0854)  HIV: NONREACTIVE (04/28 1445)  GBS:   neg  CBC    Component Value Date/Time   WBC 9.4 10/04/2013 0916   RBC 4.76 10/04/2013 0916   HGB 11.6* 10/04/2013 0916   HCT 36.3 10/04/2013 0916   PLT 211 10/04/2013 0916   MCV 76.3* 10/04/2013 0916   MCH 24.4* 10/04/2013 0916   MCHC 32.0 10/04/2013 0916   RDW 14.0 10/04/2013 0916   LYMPHSABS 1.8 07/18/2012 0939   MONOABS  0.4 07/18/2012 0939   EOSABS 0.8* 07/18/2012 0939   BASOSABS 0.0 07/18/2012 0939     Assessment/Plan: Unable to consent pt due to inability to get interpreter.  Interpreter scheduled for tomorrow.  Pt rescheduled for tomorrow at 1:30 pm.  Dr. Anyanwu aware.  Pt is nonimmune.  Needs rubella vac post delivery  HARRAWAY-SMITH, Elizah Lydon 10/04/2013, 9:52 AM     

## 2013-10-05 NOTE — Transfer of Care (Signed)
Immediate Anesthesia Transfer of Care Note  Patient: [name]  Procedure(s) Performed: Procedure(s): EXPLORATORY LAPAROTOMY (N/A)  Patient Location: PACU  Anesthesia Type:Spinal  Level of Consciousness: oriented and sedated  Airway & Oxygen Therapy: Patient Spontanous Breathing  Post-op Assessment: Report given to PACU RN and Post -op Vital signs reviewed and stable  Post vital signs: Reviewed and stable  Complications: No apparent anesthesia complications

## 2013-10-05 NOTE — OR Nursing (Signed)
Pt to return to the OR for Exploratory Laparotomy for incisional bleeding

## 2013-10-06 ENCOUNTER — Encounter (HOSPITAL_COMMUNITY): Payer: Self-pay | Admitting: Obstetrics & Gynecology

## 2013-10-06 LAB — CBC
HCT: 28.7 % — ABNORMAL LOW (ref 36.0–46.0)
Hemoglobin: 9.1 g/dL — ABNORMAL LOW (ref 12.0–15.0)
MCH: 24.2 pg — AB (ref 26.0–34.0)
MCHC: 31.7 g/dL (ref 30.0–36.0)
MCV: 76.3 fL — ABNORMAL LOW (ref 78.0–100.0)
Platelets: 194 10*3/uL (ref 150–400)
RBC: 3.76 MIL/uL — AB (ref 3.87–5.11)
RDW: 13.7 % (ref 11.5–15.5)
WBC: 18.5 10*3/uL — ABNORMAL HIGH (ref 4.0–10.5)

## 2013-10-06 MED ORDER — MEASLES, MUMPS & RUBELLA VAC ~~LOC~~ INJ
0.5000 mL | INJECTION | Freq: Once | SUBCUTANEOUS | Status: AC
  Administered 2013-10-07: 0.5 mL via SUBCUTANEOUS
  Filled 2013-10-06 (×2): qty 0.5

## 2013-10-06 MED ORDER — MEASLES, MUMPS & RUBELLA VAC ~~LOC~~ INJ: 0.5000 mL | INJECTION | Freq: Once | SUBCUTANEOUS | Status: DC

## 2013-10-06 NOTE — Anesthesia Postprocedure Evaluation (Signed)
  Anesthesia Post-op Note  Patient: [name]  Procedure(s) Performed: Procedure(s): EXPLORATORY LAPAROTOMY (N/A)  Patient Location: Mother/Baby  Anesthesia Type:Spinal  Level of Consciousness: awake, alert  and oriented  Airway and Oxygen Therapy: Patient Spontanous Breathing  Post-op Pain: none  Post-op Assessment: Post-op Vital signs reviewed, Patient's Cardiovascular Status Stable, Respiratory Function Stable, No headache, No backache, No residual numbness and No residual motor weakness  Post-op Vital Signs: Reviewed and stable  Last Vitals:  Filed Vitals:   10/06/13 0900  BP: 110/68  Pulse: 80  Temp: 36.9 C  Resp: 18    Complications: No apparent anesthesia complications

## 2013-10-06 NOTE — Addendum Note (Signed)
Addendum created 10/06/13 1240 by Shanon PayorSuzanne M Shalayah Beagley, CRNA   Modules edited: Notes Section   Notes Section:  File: [id number]

## 2013-10-06 NOTE — Addendum Note (Signed)
Addendum created 10/06/13 1239 by Shanon PayorSuzanne M Ford Peddie, CRNA   Modules edited: Notes Section   Notes Section:  File: [id number]

## 2013-10-06 NOTE — Lactation Note (Signed)
This note was copied from the chart of Kristina H Anselm PancoastDon Duprey. Lactation Consultation Note [facility]c states mom had stated she wants to bottle and breast feed, and there is presently an in-house interpreter available.  At time of consult, dad is feeding baby formula. Mom states she wants to breast feed but does not have any milk. Mom has 2 other children, states she breastfed one child for a year, with no difficulties.   Discussed supply and demand, discussed the importance of early and frequent breastfeeding. Enc mom to offer breast before bottle. Mom did not seem receptive to teaching, and dad continued feeding formula with no attempt to latch baby. Offered to assist with a breastfeeding, and enc mom to call for assistance when ready.   Lactation brochure given to mom.   Patient Name: [name] Kristina Rappaport [id number]'s Date: 10/06/2013 Reason for consult: Follow-up assessment   Maternal Data Reason for exclusion: Mother's choice to formula feed on admision  Feeding Feeding Type: Formula  LATCH Score/Interventions                      Lactation Tools Discussed/Used     Consult Status Consult Status: Complete    Lenard ForthSanders, Zaccai Chavarin Fulmer 10/06/2013, 2:58 PM

## 2013-10-06 NOTE — Plan of Care (Signed)
Problem: Consults Goal: Postpartum Patient Education (See Patient Education module for education specifics.)  Outcome: Progressing With Jerai montanyard inturpreter

## 2013-10-06 NOTE — Progress Notes (Signed)
Subjective: Postpartum Day 1: Cesarean Delivery Patient denies pain (only question able to ask patient, she denies pain)  Objective: Vital signs in last 24 hours: Temp:  [97.6 F (36.4 C)-99.5 F (37.5 C)] 98.2 F (36.8 C) (07/22 0520) Pulse Rate:  [64-93] 82 (07/22 0520) Resp:  [12-32] 18 (07/22 0520) BP: (88-131)/(48-77) 110/65 mmHg (07/22 0520) SpO2:  [94 %-100 %] 98 % (07/22 0520) Weight:  [73 kg (160 lb 15 oz)] 73 kg (160 lb 15 oz) (07/21 1346)  Physical Exam:  General: alert and cooperative Lochia: appropriate Uterine Fundus: firm Incision: no significant drainage, wound dressed with same dressing from last night, clean/dry from exterior. DVT Evaluation: Has sequential compression devices   Recent Labs  10/04/13 0916 10/06/13 0510  HGB 11.6* 9.1*  HCT 36.3 28.7*    Assessment/Plan: Status post Cesarean section. Postoperative course complicated by persistent bleeding from incision site, s/p re-evaluation and additional sutures placed at hysterotomy  Continue current care.  Exam limited due to language barrier. Interview was attempted multiple times however no interpreter either via language line or in person was available to fully interview patient, family also not present.  Will continue to seek interpreter for interview. Patient is still catheterized and has SCDs.   [name], [name] 10/06/2013, 7:56 AM

## 2013-10-06 NOTE — Progress Notes (Signed)
UR chart review completed.  

## 2013-10-06 NOTE — Anesthesia Postprocedure Evaluation (Signed)
  Anesthesia Post-op Note  Patient: [name]  Procedure(s) Performed: Procedure(s): REPEAT CESAREAN SECTION WITH BILATERAL TUBAL LIGATION (N/A)  Patient Location: Mother/Baby  Anesthesia Type:Spinal  Level of Consciousness: awake, alert  and oriented  Airway and Oxygen Therapy: Patient Spontanous Breathing  Post-op Pain: none  Post-op Assessment: Post-op Vital signs reviewed, Patient's Cardiovascular Status Stable, Respiratory Function Stable, No headache, No backache, No residual numbness and No residual motor weakness  Post-op Vital Signs: Reviewed and stable  Last Vitals:  Filed Vitals:   10/06/13 0900  BP: 110/68  Pulse: 80  Temp: 36.9 C  Resp: 18    Complications: No apparent anesthesia complications

## 2013-10-07 MED ORDER — PRENATAL MULTIVITAMIN CH: 1.0000 | ORAL_TABLET | Freq: Every day | ORAL | Status: DC

## 2013-10-07 MED ORDER — IBUPROFEN 600 MG PO TABS: 600.0000 mg | ORAL_TABLET | Freq: Four times a day (QID) | ORAL | Status: DC

## 2013-10-07 MED ORDER — OXYCODONE-ACETAMINOPHEN 5-325 MG PO TABS: 1.0000 | ORAL_TABLET | ORAL | Status: DC | PRN

## 2013-10-07 NOTE — Discharge Instructions (Signed)

## 2013-10-07 NOTE — Discharge Summary (Signed)
Obstetric Discharge Summary Reason for Admission: cesarean section Prenatal Procedures: NST Intrapartum Procedures: cesarean: low cervical, transverse and tubal ligation Postpartum Procedures: none Complications-Operative and Postpartum: oozing from hysterotomy requiring re-opening of abdomen and several figure of 8 sutures Hemoglobin  Date Value Ref Range Status  10/06/2013 9.1* 12.0 - 15.0 g/dL Final     DELTA CHECK NOTED     REPEATED TO VERIFY     HCT  Date Value Ref Range Status  10/06/2013 28.7* 36.0 - 46.0 % Final   (Hgb was 11.6 upon admission)  38 year old [id number] admitted for repeat C-section and tubal ligation at term. Patient taken to the OR on 7/20 at 1600 for c-section which was performed per routine procedure. While patient was recovering in PACU there was some blood oozing from incision site. She was taken immediately back to the OR and re-opened. There was blood noticed oozing from her hysterotomy and several figure of 8 sutures were used to obtain hemostasis. She was closed again and brought back for recovery. Patient's pain has been controlled with percocet. She was ambulating, voiding, passing flatus and eating. Incision was clean and no active bleeding noticed. No signs of infection. Vitals were stable and she was discharged home with followup in four weeks.  Physical Exam:  General: alert, cooperative and no distress Lochia: appropriate Uterine Fundus: firm Incision: healing well, no significant drainage DVT Evaluation: No evidence of DVT seen on physical exam. Negative Homan's sign. No cords or calf tenderness.  Discharge Diagnoses: Term Pregnancy-delivered  Discharge Information: Date: 10/07/2013 Activity: pelvic rest Diet: routine Medications: Ibuprofen and Percocet Condition: stable Instructions: Contact physician for any worsening pain, shortness of breath, nausea, bleeding or fever Discharge to: home Follow-up Information   [facility] 4 weeks.   Specialty:  Obstetrics and Gynecology   Contact information:   [address] [place] [address] [phone]       Newborn Data: Live born female  Birth Weight: 6 lb 8 oz (2948 g) APGAR: 8, 9  Home with mother.  Markus JarvisStephens, Devin A 10/07/2013, 7:20 AM  I have seen and examined this patient and I agree with the above.  Cam HaiSHAW, Jesus Nevills CNM 7:45 AM 10/07/2013

## 2013-11-11 ENCOUNTER — Ambulatory Visit (INDEPENDENT_AMBULATORY_CARE_PROVIDER_SITE_OTHER): Payer: Medicaid Other | Admitting: Obstetrics & Gynecology

## 2013-11-11 ENCOUNTER — Encounter: Payer: Self-pay | Admitting: Obstetrics & Gynecology

## 2013-11-11 NOTE — Progress Notes (Signed)
Patient ID: [name], female   DOB: 1976/01/16, 38 y.o.   MRN: [id number] Subjective:6 weeks post cesarean section     [name] is a 38 y.o. female who presents for a postpartum visit. She is 6 weeks postpartum following a BTL and repeat cesarean, wound explored postop for bleeding. I have fully reviewed the prenatal and intrapartum course. The delivery was at 39.3 gestational weeks. Outcome: repeat cesarean section, low transverse incision. Anesthesia: spinal. Postpartum course has been good. Baby's course has been good. Baby is feeding by breast. Bleeding no bleeding. Bowel function is normal. Bladder function is normal. Patient is not sexually active. Contraception method is tubal ligation. Postpartum depression screening: negative.  The following portions of the patient's history were reviewed and updated as appropriate: allergies, current medications, past family history, past medical history, past social history, past surgical history and problem list.  Review of Systems Pertinent items are noted in HPI.   Objective:    Temp(Src) 98.2 F (36.8 C) (Oral)  Ht 4' 11.06" (1.5 m)  Wt 142 lb 4.8 oz (64.547 kg)  BMI 28.69 kg/m2  Breastfeeding? Yes  General:  alert, cooperative and no distress   Breasts:     Lungs:    Heart:     Abdomen:  incision intact staple removed   Vulva:  not evaluated  Vagina: not evaluated  Cervix:     Corpus: not examined  Adnexa:  not evaluated  Rectal Exam: Not performed.        Assessment:     normal postpartum exam. Pap smear not done at today's visit.   Plan:    1. Contraception: tubal ligation 2. Yearly exam and pap in 4.5 years 3. Follow up as needed.   Adam Phenix, MD 11/11/2013

## 2013-11-11 NOTE — Patient Instructions (Signed)

## 2013-11-23 ENCOUNTER — Encounter (HOSPITAL_COMMUNITY): Payer: Self-pay | Admitting: Obstetrics & Gynecology

## 2013-12-02 ENCOUNTER — Encounter: Payer: Self-pay | Admitting: *Deleted

## 2014-01-17 ENCOUNTER — Encounter (HOSPITAL_COMMUNITY): Payer: Self-pay | Admitting: Obstetrics & Gynecology

## 2014-02-18 ENCOUNTER — Encounter: Payer: Self-pay | Admitting: Obstetrics & Gynecology

## 2014-02-21 ENCOUNTER — Encounter: Payer: Self-pay | Admitting: Obstetrics & Gynecology

## 2019-06-12 ENCOUNTER — Ambulatory Visit: Payer: Medicaid Other | Attending: Internal Medicine

## 2019-06-12 DIAGNOSIS — Z23 Encounter for immunization: Secondary | ICD-10-CM

## 2019-06-12 NOTE — Progress Notes (Signed)
   Covid-19 Vaccination Clinic  Name:  [name]    MRN: [id number] DOB: 05/10/1975  06/12/2019  [name] was observed post Covid-19 immunization for 15 minutes without incident. She was provided with Vaccine Information Sheet and instruction to access the V-Safe system.   [name] was instructed to call 911 with any severe reactions post vaccine: Marland Kitchen Difficulty breathing  . Swelling of face and throat  . A fast heartbeat  . A bad rash all over body  . Dizziness and weakness

## 2021-12-21 ENCOUNTER — Encounter: Payer: Medicaid Other | Admitting: Internal Medicine

## 2021-12-26 ENCOUNTER — Ambulatory Visit: Payer: No Typology Code available for payment source | Admitting: Internal Medicine

## 2021-12-26 ENCOUNTER — Encounter: Payer: Self-pay | Admitting: Internal Medicine

## 2021-12-26 VITALS — BP 115/82 | HR 79 | Temp 98.0°F | Ht 60.0 in | Wt 136.7 lb

## 2021-12-26 DIAGNOSIS — Z0000 Encounter for general adult medical examination without abnormal findings: Secondary | ICD-10-CM

## 2021-12-26 DIAGNOSIS — Z205 Contact with and (suspected) exposure to viral hepatitis: Secondary | ICD-10-CM | POA: Diagnosis not present

## 2021-12-26 DIAGNOSIS — L709 Acne, unspecified: Secondary | ICD-10-CM | POA: Diagnosis not present

## 2021-12-26 NOTE — Progress Notes (Unsigned)
   CC: new to establish  HPI:[name] is a 46 y.o. female who presents for evaluation of ***. Please see individual problem based A/P for details.  Pmhx - not known - adult acne- wanting tretinoin cream. Has been a thing since husband passing. Husband passed sept 79. She has been feeling sad, but is continuing to push through. Reads bible and prays and thinks this is working well. Feels like she is coping well enough. She also has community support.  ROS otherwise negative.  No swelling in legs, rashes, no fever, chills, nightsweats, nv/d/ Meds - none Allergies - none Psghx - c section for three Family hx - husband recently passed from hep B metastatic liver cancer, no other known family hx Social hx - work - [employer] - substnace - none - adls - none - live with - 15, 42, 73, husband passed  Husband positive hep b - wanting hep b screen. Cbc, bmp, wanting med for adult acne Follow with obgyn? Flu shot, colon, pap at repeat? - no flu  Alert oriented, no icterus, rrr, lungs clear, no hepatoslenomegaly, no tenderness, no rashes, no lesions, no lee.   Check hep b, and start tx if necessary astl and hbv - topical reinoid - tretinoin  No flu shot, could do pap  Depression, PHQ-9: Based on the patients  score we have ***.  No past medical history on file. Review of Systems:   ROS   Physical Exam: There were no vitals filed for this visit.   General: *** HEENT: Conjunctiva nl , antiicteric sclerae, moist mucous membranes, no exudate or erythema Cardiovascular: Normal rate, regular rhythm.  No murmurs, rubs, or gallops Pulmonary : Equal breath sounds, No wheezes, rales, or rhonchi Abdominal: soft, nontender,  bowel sounds present Ext: No edema in lower extremities, no tenderness to palpation of lower extremities.   Assessment & Plan:   See Encounters Tab for problem based charting.  Patient {GC/GE:[id number]::"discussed with","seen with"} Dr. Daryll Drown

## 2021-12-26 NOTE — Patient Instructions (Signed)
Dear Mrs. Derringer,  Thank you for trusting [place] with your care. We are checking some blood work on you today. We are evaluated your liver, kidneys, and blood cell lines. If there are abnormalities, we will call you with the results to schedule a follow up. Otherwise, please follow up in 1 year.

## 2021-12-27 LAB — SPECIMEN STATUS REPORT

## 2021-12-27 LAB — HEMOGLOBIN A1C
Est. average glucose Bld gHb Est-mCnc: 103 mg/dL
Hgb A1c MFr Bld: 5.2 % (ref 4.8–5.6)

## 2021-12-27 LAB — HEPATITIS B SURFACE ANTIBODY,QUALITATIVE: Hep B Surface Ab, Qual: REACTIVE

## 2021-12-28 LAB — CMP14 + ANION GAP
ALT: 7 IU/L (ref 0–32)
AST: 10 IU/L (ref 0–40)
Albumin/Globulin Ratio: 1.3 (ref 1.2–2.2)
Albumin: 4.5 g/dL (ref 3.9–4.9)
Alkaline Phosphatase: 54 IU/L (ref 44–121)
Anion Gap: 17 mmol/L (ref 10.0–18.0)
BUN/Creatinine Ratio: 17 (ref 9–23)
BUN: 15 mg/dL (ref 6–24)
Bilirubin Total: 0.4 mg/dL (ref 0.0–1.2)
CO2: 23 mmol/L (ref 20–29)
Calcium: 9.1 mg/dL (ref 8.7–10.2)
Chloride: 100 mmol/L (ref 96–106)
Creatinine, Ser: 0.87 mg/dL (ref 0.57–1.00)
Globulin, Total: 3.4 g/dL (ref 1.5–4.5)
Glucose: 78 mg/dL (ref 70–99)
Potassium: 4.2 mmol/L (ref 3.5–5.2)
Sodium: 140 mmol/L (ref 134–144)
Total Protein: 7.9 g/dL (ref 6.0–8.5)
eGFR: 84 mL/min/{1.73_m2} (ref >59)

## 2021-12-28 LAB — CBC WITH DIFFERENTIAL/PLATELET
Basophils Absolute: 0 10*3/uL (ref 0.0–0.2)
Basos: 0 %
EOS (ABSOLUTE): 0.1 10*3/uL (ref 0.0–0.4)
Eos: 2 %
Hematocrit: 36.8 % (ref 34.0–46.6)
Hemoglobin: 10.9 g/dL — ABNORMAL LOW (ref 11.1–15.9)
Immature Grans (Abs): 0 10*3/uL (ref 0.0–0.1)
Immature Granulocytes: 0 %
Lymphocytes Absolute: 1.8 10*3/uL (ref 0.7–3.1)
Lymphs: 25 %
MCH: 22.8 pg — ABNORMAL LOW (ref 26.6–33.0)
MCHC: 29.6 g/dL — ABNORMAL LOW (ref 31.5–35.7)
MCV: 77 fL — ABNORMAL LOW (ref 79–97)
Monocytes Absolute: 0.5 10*3/uL (ref 0.1–0.9)
Monocytes: 7 %
Neutrophils Absolute: 4.8 10*3/uL (ref 1.4–7.0)
Neutrophils: 66 %
Platelets: 329 10*3/uL (ref 150–450)
RBC: 4.79 x10E6/uL (ref 3.77–5.28)
RDW: 13.6 % (ref 11.7–15.4)
WBC: 7.2 10*3/uL (ref 3.4–10.8)

## 2021-12-28 LAB — HEPATITIS B CORE ANTIBODY, TOTAL: Hep B Core Total Ab: POSITIVE — AB

## 2021-12-28 LAB — HCV INTERPRETATION

## 2021-12-28 LAB — HEPATITIS B SURFACE ANTIGEN: Hepatitis B Surface Ag: NEGATIVE

## 2021-12-28 LAB — HCV AB W REFLEX TO QUANT PCR: HCV Ab: NONREACTIVE

## 2021-12-28 MED ORDER — TRETINOIN 0.05 % EX LOTN: 1.0000 | TOPICAL_LOTION | Freq: Every day | CUTANEOUS | 0 refills | Status: DC

## 2021-12-31 ENCOUNTER — Encounter: Payer: Self-pay | Admitting: Internal Medicine

## 2021-12-31 ENCOUNTER — Other Ambulatory Visit: Payer: Self-pay

## 2021-12-31 DIAGNOSIS — L709 Acne, unspecified: Secondary | ICD-10-CM | POA: Insufficient documentation

## 2021-12-31 DIAGNOSIS — R768 Other specified abnormal immunological findings in serum: Secondary | ICD-10-CM | POA: Insufficient documentation

## 2021-12-31 DIAGNOSIS — Z205 Contact with and (suspected) exposure to viral hepatitis: Secondary | ICD-10-CM | POA: Insufficient documentation

## 2021-12-31 NOTE — Assessment & Plan Note (Signed)
acne since husband passed. Wanting tretinoin cream Present on face bilaterally.  - topical tretinoin.

## 2021-12-31 NOTE — Telephone Encounter (Signed)
Error/disregard

## 2021-12-31 NOTE — Assessment & Plan Note (Signed)
Patient reports husband recently passed from [facility] and had hx of heptitis B.   No obvious sequelae from exposure seen on exam. Patient is without skin findings or hepatosplenomegaly. Hepatic synthetic function intact. 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

## 2022-01-01 NOTE — Progress Notes (Signed)
Internal Medicine Clinic Attending  Case discussed with Dr. Elliot Gurney  at the time of the visit.  We reviewed the resident's history and exam and pertinent patient test results.  I agree with the assessment, diagnosis, and plan of care documented in the resident's note.   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

## 2022-01-04 ENCOUNTER — Telehealth: Payer: Self-pay

## 2022-01-04 DIAGNOSIS — L709 Acne, unspecified: Secondary | ICD-10-CM

## 2022-01-04 NOTE — Telephone Encounter (Addendum)
Prior Authorization for patient came through on cover my meds, spoke to [facility]. Patients insurance does not cover Altreno/Tretinoin. [facility] stated patients insurance cover alternatives such as Azithromycin gel,Clindamycin Gel or Azelex cream please send in alternative prescription.

## 2022-01-08 MED ORDER — CLINDAMYCIN PHOSPHATE 1 % EX GEL: CUTANEOUS | 0 refills | Status: DC

## 2022-01-08 NOTE — Telephone Encounter (Signed)
Prior authorization does not cover Tretinoin cream.  I sent in clinda mycin cream as alternative.

## 2022-01-11 ENCOUNTER — Other Ambulatory Visit: Payer: Self-pay | Admitting: Internal Medicine

## 2022-01-11 MED ORDER — TRETINOIN 0.05 % EX CREA: TOPICAL_CREAM | Freq: Every day | CUTANEOUS | 0 refills | Status: DC

## 2022-12-25 ENCOUNTER — Encounter: Payer: No Typology Code available for payment source | Admitting: Internal Medicine

## 2023-02-18 ENCOUNTER — Encounter: Payer: PRIVATE HEALTH INSURANCE | Admitting: Student

## 2023-03-25 ENCOUNTER — Encounter: Payer: Self-pay | Admitting: Student

## 2023-03-25 ENCOUNTER — Ambulatory Visit (INDEPENDENT_AMBULATORY_CARE_PROVIDER_SITE_OTHER): Payer: PRIVATE HEALTH INSURANCE | Admitting: Student

## 2023-03-25 VITALS — BP 124/71 | HR 73 | Temp 98.1°F | Ht 60.0 in | Wt 143.6 lb

## 2023-03-25 DIAGNOSIS — D509 Iron deficiency anemia, unspecified: Secondary | ICD-10-CM | POA: Diagnosis not present

## 2023-03-25 DIAGNOSIS — Z1211 Encounter for screening for malignant neoplasm of colon: Secondary | ICD-10-CM

## 2023-03-25 DIAGNOSIS — L709 Acne, unspecified: Secondary | ICD-10-CM

## 2023-03-25 DIAGNOSIS — Z0000 Encounter for general adult medical examination without abnormal findings: Secondary | ICD-10-CM | POA: Insufficient documentation

## 2023-03-25 MED ORDER — CLINDAMYCIN PHOSPHATE 1 % EX GEL: CUTANEOUS | 1 refills | Status: DC

## 2023-03-25 NOTE — Assessment & Plan Note (Addendum)
-  Declined pap smear today, agreeable to completing it in future -Declined GI referral for screening colonoscopy (ADDENDUM: patient decided to do screening colonoscopy, referral placed) -Plan for 1 year routine f/u, earlier if needed

## 2023-03-25 NOTE — Assessment & Plan Note (Signed)
 Last CBC in 2023 showed microcytic anemia. Patient unaware. Denies any heavy menstrual bleeding. No overt signs of bleeding. Asymptomatic and HDS. Not on any supplementation. Query if IDA.   Plan -Repeat CBC -Iron studies

## 2023-03-25 NOTE — Assessment & Plan Note (Addendum)
 Exam showed facial acne. No acute worsening. Has tried clindamycin gel with improvement. Refill sent today. (ADDENDUM: d/c clindamycin gel, reordered tretinoin cream daily for patient)

## 2023-03-25 NOTE — Patient Instructions (Addendum)
 C?m ?n c Maribelle H Delk ? cho php chng ti cung c?p d?ch v? ch?m [facility] cho b?n ngy hm nay. Hm nay chng ta ? th?o lu?n   -Ti r?t vui v hm nay b?n ?ang lm t?t. Vui lng cho chng ti bi?t n?u b?n c b?t k? th?c m?c no. -Refill g?i cho kem tr? m?n cho da m?t c?a b?n (clindamycin  (CLINDAGEL) 1% gel: Thoa m?t l?p gel m?ng hai l?n m?i ngy) -Xt nghi?m mu hm nay ?? ki?m tra cng th?c mu v n?ng ?? s?t    -L?p k? ho?ch th?m khm ?? hon thnh ph?t t? bo c? t? cung -Th?m khm v?n phng th??ng xuyn trong 1 n?m  Theo di: 1 thng ??i v?i ph?t t? bo c? t? cung sau ? 12 thng ??i v?i ki?m tra ??nh k?    N?u b?n c b?t k? cu h?i ho?c th?c m?c no, vui lng g?i cho phng khm n?i khoa theo s? [phone].    --------------------- Thank you, Ms.Todd DEL Dingee for allowing us  to provide your care today. Today we discussed   -I am glad you are doing well today. Please let us  know if you have any concerns. -Refill sent for the acne cream for your face (clindamycin  (CLINDAGEL) 1 % gel: Apply thin layer of gel twice daily) -Blood work today to check blood counts and iron level    -Plan for visit to complete pap smear -Regular office visit in 1 year  I have ordered the following medication/changed the following medications:   Start the following medications: Meds ordered this encounter  Medications   clindamycin  (CLINDAGEL) 1 % gel    Sig: Apply thin layer of gel twice daily.    Dispense:  30 g    Refill:  1     Follow up:  1 month for pap smear then 12 months for routine check    Should you have any questions or concerns please call the internal medicine clinic at [phone].    Vieno Tarrant, D.O. [facility]

## 2023-03-25 NOTE — Progress Notes (Addendum)
   CC: 1 year f/u  HPI:  [name] is a 48 y.o. female living with a history stated below and presents today for 1 year f/u. Please see problem based assessment and plan for additional details.  History reviewed. No pertinent past medical history.  No current outpatient medications on file prior to visit.   No current facility-administered medications on file prior to visit.    History reviewed. No pertinent family history.  Social History   Socioeconomic History   Marital status: Married    Spouse name: Not on file   Number of children: Not on file   Years of education: Not on file   Highest education level: Not on file  Occupational History   Not on file  Tobacco Use   Smoking status: Never   Smokeless tobacco: Never  Substance and Sexual Activity   Alcohol use: No   Drug use: No   Sexual activity: Yes    Birth control/protection: None  Other Topics Concern   Not on file  Social History Narrative   Not on file   Social Drivers of Health   Financial Resource Strain: Not on file  Food Insecurity: No Food Insecurity (03/25/2023)   Hunger Vital Sign    Worried About Running Out of Food in the Last Year: Never true    Ran Out of Food in the Last Year: Never true  Transportation Needs: No Transportation Needs (03/25/2023)   PRAPARE - Administrator, Civil Service (Medical): No    Lack of Transportation (Non-Medical): No  Physical Activity: Not on file  Stress: Not on file  Social Connections: Not on file  Intimate Partner Violence: Not At Risk (03/25/2023)   Humiliation, Afraid, Rape, and Kick questionnaire    Fear of Current or Ex-Partner: No    Emotionally Abused: No    Physically Abused: No    Sexually Abused: No    Review of Systems: ROS negative except for what is noted on the assessment and plan.  Vitals:   03/25/23 1552  BP: 124/71  Pulse: 73  Temp: 98.1 F (36.7 C)  TempSrc: Oral  SpO2: 100%  Weight: 143 lb 9.6 oz (65.1 kg)   Height: 5' (1.524 m)   Physical Exam: Constitutional: well-appearing female sitting in chair comfortably, in no acute distress HENT: normocephalic atraumatic, mucous membranes moist Eyes: no conjunctival pallor Cardiovascular: regular rate and rhythm, 2+ radial pulse bilaterally Pulmonary/Chest: normal work of breathing on room air, lungs clear to auscultation bilaterally Neurological: alert & oriented x 3 Skin: mild facial acne bilaterally, no pustules or cysts   Assessment & Plan:   Adult acne Exam showed facial acne. No acute worsening. Has tried clindamycin  gel with improvement. Refill sent today. (ADDENDUM: d/c clindamycin  gel, reordered tretinoin  cream daily for patient)  Microcytic anemia Last CBC in 2023 showed microcytic anemia. Patient unaware. Denies any heavy menstrual bleeding. No overt signs of bleeding. Asymptomatic and HDS. Not on any supplementation. Query if IDA.   Plan -Repeat CBC -Iron studies   Healthcare maintenance -Declined pap smear today, agreeable to completing it in future -Declined GI referral for screening colonoscopy (ADDENDUM: patient decided to do screening colonoscopy, referral placed) -Plan for 1 year routine f/u, earlier if needed    Patient discussed with Dr. Machen  Nicholis Stepanek, D.O. [facility] Internal Medicine, PGY-2 Phone: [phone] Date 03/25/2023 Time 4:42 PM

## 2023-03-26 LAB — CBC
Hematocrit: 34.7 % (ref 34.0–46.6)
Hemoglobin: 10.3 g/dL — ABNORMAL LOW (ref 11.1–15.9)
MCH: 21.9 pg — ABNORMAL LOW (ref 26.6–33.0)
MCHC: 29.7 g/dL — ABNORMAL LOW (ref 31.5–35.7)
MCV: 74 fL — ABNORMAL LOW (ref 79–97)
Platelets: 327 10*3/uL (ref 150–450)
RBC: 4.71 x10E6/uL (ref 3.77–5.28)
RDW: 13.2 % (ref 11.7–15.4)
WBC: 6.7 10*3/uL (ref 3.4–10.8)

## 2023-03-26 LAB — FERRITIN: Ferritin: 82 ng/mL (ref 15–150)

## 2023-03-26 LAB — IRON AND TIBC
Iron Saturation: 32 % (ref 15–55)
Iron: 96 ug/dL (ref 27–159)
Total Iron Binding Capacity: 303 ug/dL (ref 250–450)
UIBC: 207 ug/dL (ref 131–425)

## 2023-03-26 MED ORDER — TRETINOIN 0.05 % EX CREA: TOPICAL_CREAM | Freq: Every day | CUTANEOUS | 2 refills | Status: AC

## 2023-03-26 NOTE — Addendum Note (Signed)
 Addended by: Rana Snare on: 03/26/2023 01:44 PM   Modules accepted: Orders

## 2023-03-26 NOTE — Progress Notes (Signed)
 Internal Medicine Clinic Attending  Case discussed with the resident at the time of the visit.  We reviewed the resident's history and exam and pertinent patient test results.  I agree with the assessment, diagnosis, and plan of care documented in the resident's note.

## 2023-04-04 ENCOUNTER — Telehealth: Payer: Self-pay | Admitting: *Deleted

## 2023-04-04 NOTE — Telephone Encounter (Signed)
Call from patient stating that Pharmacy did not receive the prescription for her Tretinoin Creme.  Call to Pharmacy who verified that prescription had been received but had not been filled.  Pharmacy to fill medication and have ready for patient by tomorrow morning at 8 AM.  Spoke with [facility] who will notify the patient.
